# Patient Record
Sex: Female | Born: 1935
Health system: Southern US, Community
[De-identification: ages and names within clinical notes are randomized; demographics above are authoritative.]

## PROBLEM LIST (undated history)

## (undated) DIAGNOSIS — Z7952 Long term (current) use of systemic steroids: Secondary | ICD-10-CM

## (undated) DIAGNOSIS — J45901 Unspecified asthma with (acute) exacerbation: Secondary | ICD-10-CM

## (undated) DIAGNOSIS — E785 Hyperlipidemia, unspecified: Secondary | ICD-10-CM

## (undated) DIAGNOSIS — I10 Essential (primary) hypertension: Secondary | ICD-10-CM

## (undated) DIAGNOSIS — N189 Chronic kidney disease, unspecified: Secondary | ICD-10-CM

## (undated) DIAGNOSIS — T7840XA Allergy, unspecified, initial encounter: Secondary | ICD-10-CM

## (undated) DIAGNOSIS — M1990 Unspecified osteoarthritis, unspecified site: Secondary | ICD-10-CM

## (undated) DIAGNOSIS — I2510 Atherosclerotic heart disease of native coronary artery without angina pectoris: Secondary | ICD-10-CM

## (undated) DIAGNOSIS — D649 Anemia, unspecified: Secondary | ICD-10-CM

## (undated) DIAGNOSIS — J189 Pneumonia, unspecified organism: Secondary | ICD-10-CM

## (undated) DIAGNOSIS — M316 Other giant cell arteritis: Secondary | ICD-10-CM

## (undated) DIAGNOSIS — I209 Angina pectoris, unspecified: Secondary | ICD-10-CM

## (undated) DIAGNOSIS — K5790 Diverticulosis of intestine, part unspecified, without perforation or abscess without bleeding: Secondary | ICD-10-CM

## (undated) DIAGNOSIS — J45909 Unspecified asthma, uncomplicated: Secondary | ICD-10-CM

## (undated) DIAGNOSIS — G629 Polyneuropathy, unspecified: Secondary | ICD-10-CM

## (undated) DIAGNOSIS — J8410 Pulmonary fibrosis, unspecified: Secondary | ICD-10-CM

## (undated) DIAGNOSIS — K449 Diaphragmatic hernia without obstruction or gangrene: Secondary | ICD-10-CM

## (undated) DIAGNOSIS — M810 Age-related osteoporosis without current pathological fracture: Secondary | ICD-10-CM

## (undated) HISTORY — DX: Diaphragmatic hernia without obstruction or gangrene: K44.9

## (undated) HISTORY — DX: Long term (current) use of systemic steroids: Z79.52

## (undated) HISTORY — DX: Pulmonary fibrosis, unspecified: J84.10

## (undated) HISTORY — PX: CATARACT EXTRACTION, BILATERAL: SHX1313

## (undated) HISTORY — DX: Unspecified asthma, uncomplicated: J45.909

## (undated) HISTORY — DX: Diverticulosis of intestine, part unspecified, without perforation or abscess without bleeding: K57.90

## (undated) HISTORY — DX: Angina pectoris, unspecified: I20.9

## (undated) HISTORY — DX: Other giant cell arteritis: M31.6

## (undated) HISTORY — DX: Atherosclerotic heart disease of native coronary artery without angina pectoris: I25.10

## (undated) HISTORY — DX: Unspecified osteoarthritis, unspecified site: M19.90

## (undated) HISTORY — DX: Hyperlipidemia, unspecified: E78.5

## (undated) HISTORY — PX: BLADDER SURGERY: SHX569

## (undated) HISTORY — DX: Anemia, unspecified: D64.9

## (undated) HISTORY — PX: COLON SURGERY: SHX602

## (undated) HISTORY — DX: Polyneuropathy, unspecified: G62.9

---
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
## 1999-03-30 HISTORY — PX: UPPER GI ENDOSCOPY: SHX6162

## 1999-03-30 HISTORY — PX: COLONOSCOPY: SHX174

## 2004-05-25 ENCOUNTER — Inpatient Hospital Stay: Payer: Self-pay | Admitting: Internal Medicine

## 2004-08-08 ENCOUNTER — Ambulatory Visit: Payer: Self-pay | Admitting: Nurse Practitioner

## 2004-11-21 ENCOUNTER — Ambulatory Visit: Payer: Self-pay

## 2005-01-09 ENCOUNTER — Ambulatory Visit: Payer: Self-pay

## 2006-05-23 ENCOUNTER — Ambulatory Visit: Payer: Self-pay | Admitting: Family Medicine

## 2006-08-17 ENCOUNTER — Inpatient Hospital Stay: Payer: Self-pay | Admitting: Internal Medicine

## 2006-09-17 ENCOUNTER — Ambulatory Visit: Payer: Self-pay | Admitting: Specialist

## 2007-07-30 ENCOUNTER — Ambulatory Visit: Payer: Self-pay | Admitting: Family Medicine

## 2008-08-04 ENCOUNTER — Ambulatory Visit: Payer: Self-pay | Admitting: Family Medicine

## 2008-09-19 ENCOUNTER — Inpatient Hospital Stay: Payer: Self-pay | Admitting: Internal Medicine

## 2010-01-14 ENCOUNTER — Emergency Department: Payer: Self-pay | Admitting: Internal Medicine

## 2010-01-21 ENCOUNTER — Emergency Department: Payer: Self-pay | Admitting: Internal Medicine

## 2010-07-19 ENCOUNTER — Encounter: Payer: Self-pay | Admitting: Otolaryngology

## 2010-08-16 ENCOUNTER — Encounter: Payer: Self-pay | Admitting: Otolaryngology

## 2010-09-14 ENCOUNTER — Encounter: Payer: Self-pay | Admitting: Otolaryngology

## 2010-12-06 ENCOUNTER — Observation Stay: Payer: Self-pay | Admitting: Surgery

## 2010-12-14 ENCOUNTER — Ambulatory Visit: Payer: Self-pay | Admitting: Cardiothoracic Surgery

## 2010-12-15 ENCOUNTER — Ambulatory Visit: Payer: Self-pay | Admitting: Cardiothoracic Surgery

## 2011-01-14 ENCOUNTER — Ambulatory Visit: Payer: Self-pay | Admitting: Cardiothoracic Surgery

## 2012-09-20 ENCOUNTER — Emergency Department: Payer: Self-pay | Admitting: Emergency Medicine

## 2012-11-16 ENCOUNTER — Emergency Department: Payer: Self-pay | Admitting: Emergency Medicine

## 2012-11-16 LAB — COMPREHENSIVE METABOLIC PANEL
Albumin: 3.7 g/dL (ref 3.4–5.0)
Alkaline Phosphatase: 75 U/L (ref 50–136)
Anion Gap: 7 (ref 7–16)
BUN: 39 mg/dL — ABNORMAL HIGH (ref 7–18)
Bilirubin,Total: 0.2 mg/dL (ref 0.2–1.0)
Calcium, Total: 9.1 mg/dL (ref 8.5–10.1)
Chloride: 110 mmol/L — ABNORMAL HIGH (ref 98–107)
Co2: 25 mmol/L (ref 21–32)
Creatinine: 1.68 mg/dL — ABNORMAL HIGH (ref 0.60–1.30)
Glucose: 126 mg/dL — ABNORMAL HIGH (ref 65–99)
Osmolality: 294 (ref 275–301)
Potassium: 4.2 mmol/L (ref 3.5–5.1)
SGOT(AST): 29 U/L (ref 15–37)
Sodium: 142 mmol/L (ref 136–145)

## 2012-11-16 LAB — CBC
HCT: 34.6 % — ABNORMAL LOW (ref 40.0–52.0)
MCH: 27.1 pg (ref 26.0–34.0)
MCHC: 32.9 g/dL (ref 32.0–36.0)
RBC: 4.2 10*6/uL — ABNORMAL LOW (ref 4.40–5.90)
RDW: 14.8 % — ABNORMAL HIGH (ref 11.5–14.5)
WBC: 5.4 10*3/uL (ref 3.8–10.6)

## 2013-10-14 ENCOUNTER — Emergency Department: Payer: Self-pay | Admitting: Emergency Medicine

## 2013-10-14 DIAGNOSIS — M316 Other giant cell arteritis: Secondary | ICD-10-CM

## 2013-10-14 HISTORY — DX: Other giant cell arteritis: M31.6

## 2013-10-14 LAB — CBC WITH DIFFERENTIAL/PLATELET
Basophil #: 0.1 10*3/uL (ref 0.0–0.1)
Basophil %: 1 %
Eosinophil #: 0.2 10*3/uL (ref 0.0–0.7)
Eosinophil %: 4.6 %
HCT: 34.9 % — ABNORMAL LOW (ref 40.0–52.0)
HGB: 11.1 g/dL — ABNORMAL LOW (ref 12.0–16.0)
Lymphocyte #: 2 10*3/uL (ref 1.0–3.6)
Lymphocyte %: 38.4 %
MCH: 26.6 pg (ref 26.0–34.0)
MCHC: 31.7 g/dL — ABNORMAL LOW (ref 32.0–36.0)
MCV: 84 fL (ref 80–100)
Monocyte #: 0.5 10*3/uL (ref 0.2–1.0)
Monocyte %: 8.8 %
NEUTROS ABS: 2.5 10*3/uL (ref 1.4–6.5)
NEUTROS PCT: 47.2 %
Platelet: 158 10*3/uL (ref 150–440)
RBC: 4.16 10*6/uL — ABNORMAL LOW (ref 4.40–5.90)
RDW: 15 % — ABNORMAL HIGH (ref 11.5–14.5)
WBC: 5.2 10*3/uL (ref 3.8–10.6)

## 2013-10-14 LAB — COMPREHENSIVE METABOLIC PANEL
ALBUMIN: 3.6 g/dL (ref 3.4–5.0)
ALT: 15 U/L (ref 12–78)
AST: 25 U/L (ref 15–37)
Alkaline Phosphatase: 88 U/L
Anion Gap: 5 — ABNORMAL LOW (ref 7–16)
BUN: 31 mg/dL — AB (ref 7–18)
Bilirubin,Total: 0.2 mg/dL (ref 0.2–1.0)
Calcium, Total: 9.1 mg/dL (ref 8.5–10.1)
Chloride: 104 mmol/L (ref 98–107)
Co2: 29 mmol/L (ref 21–32)
Creatinine: 1.6 mg/dL — ABNORMAL HIGH (ref 0.60–1.30)
EGFR (African American): 30 — ABNORMAL LOW
EGFR (Non-African Amer.): 26 — ABNORMAL LOW
Glucose: 85 mg/dL (ref 65–99)
OSMOLALITY: 281 (ref 275–301)
POTASSIUM: 4.3 mmol/L (ref 3.5–5.1)
SODIUM: 138 mmol/L (ref 136–145)
Total Protein: 8.7 g/dL — ABNORMAL HIGH (ref 6.4–8.2)

## 2013-10-14 LAB — TSH: THYROID STIMULATING HORM: 1.01 u[IU]/mL

## 2013-10-14 LAB — PRO B NATRIURETIC PEPTIDE: B-Type Natriuretic Peptide: 1195 pg/mL — ABNORMAL HIGH (ref 0–125)

## 2013-10-14 LAB — SEDIMENTATION RATE: ERYTHROCYTE SED RATE: 57 mm/h — AB (ref 0–20)

## 2013-10-14 LAB — TROPONIN I: Troponin-I: <0.02 ng/mL

## 2013-11-28 ENCOUNTER — Emergency Department: Payer: Self-pay | Admitting: Emergency Medicine

## 2013-11-28 LAB — CBC
HCT: 39 % — ABNORMAL LOW (ref 40.0–52.0)
HGB: 12.5 g/dL (ref 12.0–16.0)
MCH: 27.1 pg (ref 26.0–34.0)
MCHC: 31.9 g/dL — ABNORMAL LOW (ref 32.0–36.0)
MCV: 85 fL (ref 80–100)
PLATELETS: 99 10*3/uL — AB (ref 150–440)
RBC: 4.6 10*6/uL (ref 4.40–5.90)
RDW: 15.4 % — AB (ref 11.5–14.5)
WBC: 7.5 10*3/uL (ref 3.8–10.6)

## 2013-11-28 LAB — COMPREHENSIVE METABOLIC PANEL
ALBUMIN: 2.6 g/dL — AB (ref 3.4–5.0)
ALK PHOS: 68 U/L
ANION GAP: 6 — AB (ref 7–16)
BUN: 34 mg/dL — ABNORMAL HIGH (ref 7–18)
Bilirubin,Total: 0.3 mg/dL (ref 0.2–1.0)
CALCIUM: 8.3 mg/dL — AB (ref 8.5–10.1)
CHLORIDE: 103 mmol/L (ref 98–107)
CO2: 28 mmol/L (ref 21–32)
Creatinine: 1.47 mg/dL — ABNORMAL HIGH (ref 0.60–1.30)
EGFR (African American): 33 — ABNORMAL LOW
GFR CALC NON AF AMER: 28 — AB
GLUCOSE: 87 mg/dL (ref 65–99)
Osmolality: 281 (ref 275–301)
POTASSIUM: 4.8 mmol/L (ref 3.5–5.1)
SGOT(AST): 41 U/L — ABNORMAL HIGH (ref 15–37)
SGPT (ALT): 34 U/L (ref 12–78)
Sodium: 137 mmol/L (ref 136–145)
TOTAL PROTEIN: 6.7 g/dL (ref 6.4–8.2)

## 2013-11-28 LAB — URINALYSIS, COMPLETE
BACTERIA: NONE SEEN
BLOOD: NEGATIVE
Bilirubin,UR: NEGATIVE
GLUCOSE, UR: NEGATIVE mg/dL (ref 0–75)
Ketone: NEGATIVE
LEUKOCYTE ESTERASE: NEGATIVE
NITRITE: NEGATIVE
PH: 6 (ref 4.5–8.0)
Protein: NEGATIVE
Specific Gravity: 1.004 (ref 1.003–1.030)
Squamous Epithelial: <1
WBC UR: <1 /HPF (ref 0–5)

## 2013-11-28 LAB — ETHANOL

## 2013-11-28 LAB — DRUG SCREEN, URINE

## 2013-11-28 LAB — SALICYLATE LEVEL

## 2013-11-28 LAB — ACETAMINOPHEN LEVEL

## 2013-11-28 LAB — TSH: Thyroid Stimulating Horm: 0.64 u[IU]/mL

## 2014-01-09 ENCOUNTER — Inpatient Hospital Stay: Payer: Self-pay | Admitting: Internal Medicine

## 2014-01-09 LAB — COMPREHENSIVE METABOLIC PANEL
ALT: 21 U/L (ref 12–78)
ANION GAP: 7 (ref 7–16)
Albumin: 2.9 g/dL — ABNORMAL LOW (ref 3.4–5.0)
Alkaline Phosphatase: 64 U/L
BUN: 20 mg/dL — ABNORMAL HIGH (ref 7–18)
Bilirubin,Total: 0.3 mg/dL (ref 0.2–1.0)
CHLORIDE: 100 mmol/L (ref 98–107)
Calcium, Total: 8.4 mg/dL — ABNORMAL LOW (ref 8.5–10.1)
Co2: 29 mmol/L (ref 21–32)
Creatinine: 1.77 mg/dL — ABNORMAL HIGH (ref 0.60–1.30)
GFR CALC AF AMER: 26 — AB
GFR CALC NON AF AMER: 23 — AB
GLUCOSE: 121 mg/dL — AB (ref 65–99)
Osmolality: 276 (ref 275–301)
POTASSIUM: 4.6 mmol/L (ref 3.5–5.1)
SGOT(AST): 37 U/L (ref 15–37)
SODIUM: 136 mmol/L (ref 136–145)
Total Protein: 7.6 g/dL (ref 6.4–8.2)

## 2014-01-09 LAB — CBC
HCT: 35.8 % — AB (ref 40.0–52.0)
HGB: 11.2 g/dL — ABNORMAL LOW (ref 12.0–16.0)
MCH: 26.3 pg (ref 26.0–34.0)
MCHC: 31.2 g/dL — AB (ref 32.0–36.0)
MCV: 84 fL (ref 80–100)
Platelet: 158 10*3/uL (ref 150–440)
RBC: 4.25 10*6/uL — ABNORMAL LOW (ref 4.40–5.90)
RDW: 15.8 % — ABNORMAL HIGH (ref 11.5–14.5)
WBC: 5.4 10*3/uL (ref 3.8–10.6)

## 2014-01-09 LAB — TROPONIN I: Troponin-I: 0.09 ng/mL — ABNORMAL HIGH

## 2014-01-09 LAB — PRO B NATRIURETIC PEPTIDE: B-Type Natriuretic Peptide: 5732 pg/mL — ABNORMAL HIGH (ref 0–125)

## 2014-01-10 LAB — BASIC METABOLIC PANEL
ANION GAP: 6 — AB (ref 7–16)
BUN: 21 mg/dL — ABNORMAL HIGH (ref 7–18)
CALCIUM: 8.2 mg/dL — AB (ref 8.5–10.1)
Chloride: 100 mmol/L (ref 98–107)
Co2: 28 mmol/L (ref 21–32)
Creatinine: 1.76 mg/dL — ABNORMAL HIGH (ref 0.60–1.30)
EGFR (African American): 26 — ABNORMAL LOW
GFR CALC NON AF AMER: 23 — AB
GLUCOSE: 212 mg/dL — AB (ref 65–99)
OSMOLALITY: 278 (ref 275–301)
Potassium: 4.3 mmol/L (ref 3.5–5.1)
Sodium: 134 mmol/L — ABNORMAL LOW (ref 136–145)

## 2014-01-10 LAB — TROPONIN I
TROPONIN-I: 0.06 ng/mL — AB
Troponin-I: 0.06 ng/mL — ABNORMAL HIGH

## 2014-01-10 LAB — CBC WITH DIFFERENTIAL/PLATELET
Basophil #: 0 10*3/uL (ref 0.0–0.1)
Basophil %: 0 %
EOS PCT: 0 %
Eosinophil #: 0 10*3/uL (ref 0.0–0.7)
HCT: 32.7 % — ABNORMAL LOW (ref 40.0–52.0)
HGB: 10.3 g/dL — AB (ref 12.0–16.0)
Lymphocyte #: 0.3 10*3/uL — ABNORMAL LOW (ref 1.0–3.6)
Lymphocyte %: 6.7 %
MCH: 26.6 pg (ref 26.0–34.0)
MCHC: 31.4 g/dL — AB (ref 32.0–36.0)
MCV: 84 fL (ref 80–100)
Monocyte #: 0.1 10*3/uL — ABNORMAL LOW (ref 0.2–1.0)
Monocyte %: 3.3 %
NEUTROS PCT: 90 %
Neutrophil #: 3.7 10*3/uL (ref 1.4–6.5)
Platelet: 142 10*3/uL — ABNORMAL LOW (ref 150–440)
RBC: 3.87 10*6/uL — ABNORMAL LOW (ref 4.40–5.90)
RDW: 15.6 % — ABNORMAL HIGH (ref 11.5–14.5)
WBC: 4.1 10*3/uL (ref 3.8–10.6)

## 2014-01-11 LAB — BASIC METABOLIC PANEL
Anion Gap: 4 — ABNORMAL LOW (ref 7–16)
BUN: 21 mg/dL — AB (ref 7–18)
CREATININE: 1.62 mg/dL — AB (ref 0.60–1.30)
Calcium, Total: 8.5 mg/dL (ref 8.5–10.1)
Chloride: 105 mmol/L (ref 98–107)
Co2: 31 mmol/L (ref 21–32)
EGFR (African American): 29 — ABNORMAL LOW
EGFR (Non-African Amer.): 25 — ABNORMAL LOW
GLUCOSE: 107 mg/dL — AB (ref 65–99)
Osmolality: 283 (ref 275–301)
Potassium: 4.3 mmol/L (ref 3.5–5.1)
Sodium: 140 mmol/L (ref 136–145)

## 2014-01-14 LAB — CULTURE, BLOOD (SINGLE)

## 2014-02-07 ENCOUNTER — Observation Stay: Payer: Self-pay | Admitting: Family Medicine

## 2014-02-07 LAB — CBC
HCT: 30.3 % — AB (ref 40.0–52.0)
HGB: 9.5 g/dL — ABNORMAL LOW (ref 12.0–16.0)
MCH: 26.7 pg (ref 26.0–34.0)
MCHC: 31.4 g/dL — ABNORMAL LOW (ref 32.0–36.0)
MCV: 85 fL (ref 80–100)
Platelet: 153 10*3/uL (ref 150–440)
RBC: 3.56 10*6/uL — AB (ref 4.40–5.90)
RDW: 16.3 % — ABNORMAL HIGH (ref 11.5–14.5)
WBC: 8.4 10*3/uL (ref 3.8–10.6)

## 2014-02-07 LAB — BASIC METABOLIC PANEL
Anion Gap: 4 — ABNORMAL LOW (ref 7–16)
BUN: 23 mg/dL — AB (ref 7–18)
CHLORIDE: 105 mmol/L (ref 98–107)
Calcium, Total: 8.7 mg/dL (ref 8.5–10.1)
Co2: 33 mmol/L — ABNORMAL HIGH (ref 21–32)
Creatinine: 1.41 mg/dL — ABNORMAL HIGH (ref 0.60–1.30)
EGFR (Non-African Amer.): 30 — ABNORMAL LOW
GFR CALC AF AMER: 34 — AB
Glucose: 88 mg/dL (ref 65–99)
Osmolality: 286 (ref 275–301)
POTASSIUM: 4.4 mmol/L (ref 3.5–5.1)
SODIUM: 142 mmol/L (ref 136–145)

## 2014-02-07 LAB — URINALYSIS, COMPLETE
BILIRUBIN, UR: NEGATIVE
Bacteria: NONE SEEN
Blood: NEGATIVE
Glucose,UR: NEGATIVE mg/dL (ref 0–75)
Ketone: NEGATIVE
Nitrite: NEGATIVE
PROTEIN: NEGATIVE
Ph: 8 (ref 4.5–8.0)
RBC,UR: 2 /HPF (ref 0–5)
SPECIFIC GRAVITY: 1.01 (ref 1.003–1.030)
WBC UR: 46 /HPF (ref 0–5)

## 2014-02-08 LAB — CBC WITH DIFFERENTIAL/PLATELET
Basophil #: 0 10*3/uL (ref 0.0–0.1)
Basophil %: 0.6 %
Eosinophil #: 0.1 10*3/uL (ref 0.0–0.7)
Eosinophil %: 1.2 %
HCT: 25.8 % — ABNORMAL LOW (ref 40.0–52.0)
HGB: 8.1 g/dL — ABNORMAL LOW (ref 12.0–16.0)
LYMPHS ABS: 1 10*3/uL (ref 1.0–3.6)
LYMPHS PCT: 16.4 %
MCH: 26.5 pg (ref 26.0–34.0)
MCHC: 31.3 g/dL — ABNORMAL LOW (ref 32.0–36.0)
MCV: 85 fL (ref 80–100)
MONO ABS: 0.8 10*3/uL (ref 0.2–1.0)
Monocyte %: 14.1 %
Neutrophil #: 4 10*3/uL (ref 1.4–6.5)
Neutrophil %: 67.7 %
PLATELETS: 137 10*3/uL — AB (ref 150–440)
RBC: 3.04 10*6/uL — AB (ref 4.40–5.90)
RDW: 16 % — ABNORMAL HIGH (ref 11.5–14.5)
WBC: 5.9 10*3/uL (ref 3.8–10.6)

## 2014-02-08 LAB — BASIC METABOLIC PANEL
Anion Gap: 6 — ABNORMAL LOW (ref 7–16)
BUN: 17 mg/dL (ref 7–18)
CREATININE: 1.38 mg/dL — AB (ref 0.60–1.30)
Calcium, Total: 8.4 mg/dL — ABNORMAL LOW (ref 8.5–10.1)
Chloride: 108 mmol/L — ABNORMAL HIGH (ref 98–107)
Co2: 29 mmol/L (ref 21–32)
EGFR (African American): 35 — ABNORMAL LOW
EGFR (Non-African Amer.): 30 — ABNORMAL LOW
Glucose: 88 mg/dL (ref 65–99)
OSMOLALITY: 286 (ref 275–301)
Potassium: 4.1 mmol/L (ref 3.5–5.1)
SODIUM: 143 mmol/L (ref 136–145)

## 2014-02-10 ENCOUNTER — Encounter: Payer: Self-pay | Admitting: Internal Medicine

## 2014-02-12 LAB — SEDIMENTATION RATE: Erythrocyte Sed Rate: 42 mm/hr — ABNORMAL HIGH (ref 0–20)

## 2014-02-13 ENCOUNTER — Encounter: Payer: Self-pay | Admitting: Internal Medicine

## 2014-02-16 LAB — CBC WITH DIFFERENTIAL/PLATELET
Basophil #: 0 10*3/uL (ref 0.0–0.1)
Basophil %: 0.4 %
EOS PCT: 2.2 %
Eosinophil #: 0.1 10*3/uL (ref 0.0–0.7)
HCT: 27.2 % — AB (ref 40.0–52.0)
HGB: 8.4 g/dL — AB (ref 12.0–16.0)
LYMPHS ABS: 1.2 10*3/uL (ref 1.0–3.6)
Lymphocyte %: 20 %
MCH: 26.7 pg (ref 26.0–34.0)
MCHC: 30.9 g/dL — AB (ref 32.0–36.0)
MCV: 86 fL (ref 80–100)
MONO ABS: 0.6 10*3/uL (ref 0.2–1.0)
Monocyte %: 10.1 %
NEUTROS ABS: 4.1 10*3/uL (ref 1.4–6.5)
Neutrophil %: 67.3 %
Platelet: 217 10*3/uL (ref 150–440)
RBC: 3.15 10*6/uL — AB (ref 4.40–5.90)
RDW: 16.5 % — AB (ref 11.5–14.5)
WBC: 6.1 10*3/uL (ref 3.8–10.6)

## 2014-07-24 DIAGNOSIS — J189 Pneumonia, unspecified organism: Secondary | ICD-10-CM | POA: Diagnosis not present

## 2014-07-24 DIAGNOSIS — J449 Chronic obstructive pulmonary disease, unspecified: Secondary | ICD-10-CM | POA: Diagnosis not present

## 2014-07-24 DIAGNOSIS — M159 Polyosteoarthritis, unspecified: Secondary | ICD-10-CM | POA: Diagnosis not present

## 2014-07-24 DIAGNOSIS — E785 Hyperlipidemia, unspecified: Secondary | ICD-10-CM | POA: Diagnosis not present

## 2014-07-26 DIAGNOSIS — H1131 Conjunctival hemorrhage, right eye: Secondary | ICD-10-CM | POA: Diagnosis not present

## 2014-07-29 DIAGNOSIS — J4520 Mild intermittent asthma, uncomplicated: Secondary | ICD-10-CM | POA: Diagnosis not present

## 2014-07-29 DIAGNOSIS — R590 Localized enlarged lymph nodes: Secondary | ICD-10-CM | POA: Diagnosis not present

## 2014-08-01 DIAGNOSIS — J449 Chronic obstructive pulmonary disease, unspecified: Secondary | ICD-10-CM | POA: Diagnosis not present

## 2014-08-01 DIAGNOSIS — J189 Pneumonia, unspecified organism: Secondary | ICD-10-CM | POA: Diagnosis not present

## 2014-08-05 DIAGNOSIS — N183 Chronic kidney disease, stage 3 (moderate): Secondary | ICD-10-CM | POA: Diagnosis not present

## 2014-08-05 DIAGNOSIS — Z7952 Long term (current) use of systemic steroids: Secondary | ICD-10-CM | POA: Diagnosis not present

## 2014-08-05 DIAGNOSIS — I10 Essential (primary) hypertension: Secondary | ICD-10-CM | POA: Diagnosis not present

## 2014-08-05 DIAGNOSIS — M353 Polymyalgia rheumatica: Secondary | ICD-10-CM | POA: Diagnosis not present

## 2014-08-05 DIAGNOSIS — I5032 Chronic diastolic (congestive) heart failure: Secondary | ICD-10-CM | POA: Diagnosis not present

## 2014-08-05 DIAGNOSIS — M150 Primary generalized (osteo)arthritis: Secondary | ICD-10-CM | POA: Diagnosis not present

## 2014-08-24 DIAGNOSIS — J189 Pneumonia, unspecified organism: Secondary | ICD-10-CM | POA: Diagnosis not present

## 2014-08-24 DIAGNOSIS — M159 Polyosteoarthritis, unspecified: Secondary | ICD-10-CM | POA: Diagnosis not present

## 2014-08-24 DIAGNOSIS — J449 Chronic obstructive pulmonary disease, unspecified: Secondary | ICD-10-CM | POA: Diagnosis not present

## 2014-08-24 DIAGNOSIS — E785 Hyperlipidemia, unspecified: Secondary | ICD-10-CM | POA: Diagnosis not present

## 2014-09-01 DIAGNOSIS — J449 Chronic obstructive pulmonary disease, unspecified: Secondary | ICD-10-CM | POA: Diagnosis not present

## 2014-09-01 DIAGNOSIS — J189 Pneumonia, unspecified organism: Secondary | ICD-10-CM | POA: Diagnosis not present

## 2014-09-08 DIAGNOSIS — M353 Polymyalgia rheumatica: Secondary | ICD-10-CM | POA: Diagnosis not present

## 2014-09-22 DIAGNOSIS — J449 Chronic obstructive pulmonary disease, unspecified: Secondary | ICD-10-CM | POA: Diagnosis not present

## 2014-09-30 DIAGNOSIS — J449 Chronic obstructive pulmonary disease, unspecified: Secondary | ICD-10-CM | POA: Diagnosis not present

## 2014-09-30 DIAGNOSIS — J189 Pneumonia, unspecified organism: Secondary | ICD-10-CM | POA: Diagnosis not present

## 2014-10-06 DIAGNOSIS — J449 Chronic obstructive pulmonary disease, unspecified: Secondary | ICD-10-CM | POA: Diagnosis not present

## 2014-10-12 DIAGNOSIS — M353 Polymyalgia rheumatica: Secondary | ICD-10-CM | POA: Diagnosis not present

## 2014-10-20 DIAGNOSIS — Z933 Colostomy status: Secondary | ICD-10-CM | POA: Diagnosis not present

## 2014-10-23 DIAGNOSIS — J449 Chronic obstructive pulmonary disease, unspecified: Secondary | ICD-10-CM | POA: Diagnosis not present

## 2014-10-31 DIAGNOSIS — J449 Chronic obstructive pulmonary disease, unspecified: Secondary | ICD-10-CM | POA: Diagnosis not present

## 2014-10-31 DIAGNOSIS — J189 Pneumonia, unspecified organism: Secondary | ICD-10-CM | POA: Diagnosis not present

## 2014-11-02 DIAGNOSIS — M353 Polymyalgia rheumatica: Secondary | ICD-10-CM | POA: Diagnosis not present

## 2014-11-06 DIAGNOSIS — J449 Chronic obstructive pulmonary disease, unspecified: Secondary | ICD-10-CM | POA: Diagnosis not present

## 2014-11-06 NOTE — Discharge Summary (Signed)
PATIENT NAME:  Briana [name], Skarleth MR#:  [id number] DATE OF BIRTH:  1907/11/19  DATE OF ADMISSION:  01/09/2014.  DATE OF DISCHARGE:  01/11/2014.  ADMISSION DIAGNOSES:  Acute respiratory failure.  1.  Pneumonia.  2.  History of temporal arteritis.  3.  Acute kidney injury.  4.  Debility.  5.  Elevated troponin.  CONSULTATIONS: Physical therapy.  LABORATORIES AT DISCHARGE: Sodium 140, potassium 4.3, chloride 105, bicarbonate 31, BUN 21, creatinine 1.62, glucose 107, troponin 0.06.   HOSPITAL COURSE: This is 81 year old female who presented with shortness of breath, was found to have pneumonia.  For further details see medical records.  1.  Pneumonia, community-acquired. However, the patient has been on steroids for her temporal arteritis. She was placed on cefepime as well as vancomycin. She is doing better. She uses chronic oxygen at home with 3 liters at night. She will need this during the day, she was using oxygen during the day as well, until she can get over this pneumonia and bronchitis.  2.  Temporal arteritis. The patient was on stress-dose steroids. She will go back to her normal dose of steroids.  3.  Acute kidney injury on chronic kidney disease, stage IV. The patient will need outpatient followup.  4.  Debility with weakness due to her pneumonia. The patient will need home health.   DISCHARGE MEDICATIONS:  1.  Aspirin 81 mg daily.  2.  Fluticasone 2 sprays daily.  3.  Vitamin D2, take 50,000 units on Thursdays.    4.  Nexium 40 mg daily.  5.  Vitamin D3, one tablet daily.  6.  HCTZ 12.5 daily.  7.  Prednisone 10 mg daily.  8.  Risperdal 0.5 mg 0.5 tablet b.i.d.  9.  Levaquin 750 mg daily for 6 days.  10. Discharge oxygen 3 liters nasal cannula.   Discharge with home health with physical therapy, nurse and nurse aide.   DISCHARGE DIET: A low sodium.   DISCHARGE OXYGEN: Portable tank, at 3 liters continuous.   DISCHARGE REFERRAL: Home health.   The patient is stable for  discharge. She will follow up with her M.D. in 1 week.    TIME SPENT:  35 minutes.   ____________________________ Janyth ContesSital P. Juliene PinaMody, MD spm:lt D: 01/11/2014 12:30:52 ET T: 01/11/2014 12:44:04 ET JOB#: [id number]  cc: Sital P. Juliene PinaMody, MD, <Dictator> Janyth ContesSITAL P MODY MD ELECTRONICALLY SIGNED 01/12/2014 12:23

## 2014-11-06 NOTE — Discharge Summary (Signed)
PATIENT NAME:  Briana [name], Briana [name] MR#:  [id number] DATE OF BIRTH:  19-Nov-1907  DATE OF ADMISSION:  02/07/2014 DATE OF DISCHARGE:  02/08/2014  DISCHARGE DIAGNOSIS: Acute right 8th, 9th, and 10th rib fractures, status post fall.   DISCHARGE MEDICATIONS:  1.  Aspirin 81 mg p.o. daily.  2.  Vitamin D2, 50,000 international units once weekly.  3.  Nexium 40 mg p.o. daily.  4.  Prednisone 10 mg p.o. daily.  5.  Risperdal 0.25 mg 0.5 tablet p.o. b.i.d.  6.  Tramadol 50 mg p.o. q.6 hours as needed for pain.   The patient will need incentive spirometry as well.   PERTINENT LABORATORY AND STUDIES: The patient had chest x-ray, dedicate rib films did confirm fractures of the 8th, 9th and 10th ribs on the right.   CT of the neck was negative.   CT of the head was negative.   DISPOSITION: The patient is in stable condition to be discharged to home with incentive spirometry and pain control.   BRIEF HOSPITAL COURSE: This is a 81 year old female who was admitted with acute rib fractures status post fall who had a full workup that was negative. She feels stable and is breathing fine and wants to go back home where her son is her caretaker. I think this is appropriate. We will control her pain with Ultram. She will follow up with her PCP and continue incentive spirometry for good ventilation.    ____________________________ Marisue IvanKanhka Deiona Hooper, MD kl:lt D: 02/08/2014 08:20:59 ET T: 02/08/2014 08:28:33 ET JOB#: [id number]  cc: Marisue IvanKanhka Alizeh Madril, MD, <Dictator> Marisue IvanKANHKA Tavone Caesar MD ELECTRONICALLY SIGNED 03/15/2014 8:23

## 2014-11-06 NOTE — H&P (Signed)
PATIENT NAME:  Briana [name], [name] MR#:  [id number] DATE OF BIRTH:  07-Jun-1908  DATE OF ADMISSION:  02/07/2014  REFERRING PHYSICIAN: Governor Rooksebecca Lord, M.D.   FAMILY PROVIDER: Maurine MinisterMiriam McLaughlin, PA-C.   REASON FOR ADMISSION: Traumatic rib fractures.   HISTORY OF PRESENT ILLNESS: The patient is a [age] year old female who lives at home with a history of COPD, recent pneumonia, and temporal arteritis. She presents to the Emergency Room with rib pain after falling out of bed. In the Emergency Room, the patient was found to have rib fractures. She is unable to manage at home due to the severe pain and is now admitted for further evaluation.   PAST MEDICAL HISTORY:  1.  Temporal arteritis.  2.  COPD.  3.  Recent pneumonia.  4.  Benign hypertension.  5.  Chronic anemia.  6.  Stage III chronic kidney disease.  7.  History of cardiac murmur. 8.  Previous colostomy.   MEDICATIONS:  1.  Aspirin 81 mg p.o. daily.  2.  Nexium 40 mg p.o. daily.  3.  Prednisone 10 mg p.o. daily.  4.  Risperdal 0.125 mg p.o. b.i.d.  5.  Vitamin D2 50,000 units p.o. every week.   ALLERGIES: AMOXICILLIN.   SOCIAL HISTORY: Negative for alcohol or tobacco abuse.   FAMILY HISTORY: Negative for coronary artery disease. Essentially unremarkable.   REVIEW OF SYSTEMS: CONSTITUTIONAL: No fever or change in weight.  EYES: No blurred or double vision. No glaucoma.  ENT: No tinnitus or hearing loss. No nasal discharge or bleeding. No difficulty swallowing.  RESPIRATORY: No cough or wheezing. Denies hemoptysis.  CARDIOVASCULAR: No orthopnea or palpitations. No syncope.  GASTROINTESTINAL: No nausea, vomiting, or diarrhea. No abdominal pain.  GENITOURINARY: No dysuria or hematuria. No incontinence.  ENDOCRINE: No polyuria or polydipsia. No heat or cold intolerance.  HEMATOLOGIC: No bruising or bleeding.  LYMPHATIC: No swollen glands.  MUSCULOSKELETAL: The patient denies pain in her neck, shoulders, knees, or hips. No gout.   NEUROLOGIC: No migraines or stroke. Denies seizures.  PSYCHIATRIC: The patient denies anxiety, insomnia or depression.   PHYSICAL EXAMINATION:  GENERAL: The patient is chronically ill-appearing, but in no acute distress.  VITAL SIGNS: Currently remarkable for a blood pressure of 136/57 with a heart rate of 52, respiratory rate of 18, temperature 97.7, oxygen saturation 98% on room air.  HEENT: Normocephalic, atraumatic. Pupils equally round and reactive to light and accommodation. Extraocular movements are intact. Sclerae are anicteric. Conjunctivae are clear.  OROPHARYNX: Clear.  NECK: Supple without JVD. No adenopathy or thyromegaly is noted.  LUNGS: Scattered rhonchi, without wheezes or rales. No dullness. Respiratory effort is normal.  CARDIAC: Regular rate and rhythm with normal S1, S2. No significant rubs, murmurs or gallops. PMI is nondisplaced. Chest wall is tender to palpation along the right posterior rib cage.  ABDOMEN: Soft, nontender, with normoactive bowel sounds. No organomegaly or masses were appreciated. No hernias or bruits were noted.  EXTREMITIES: Without clubbing, cyanosis or edema. Pulses were 1+ bilaterally.  SKIN: Warm and dry without rash or lesions.  NEUROLOGIC: Cranial nerves II-XII grossly intact. Deep tendon reflexes were symmetric. Motor and sensory exam is nonfocal.  PSYCHIATRIC: Revealed a patient who was alert and oriented to person, place, and time. She was cooperative and used good judgment.   LABORATORY DATA:  Glucose 88, BUN 23, creatinine 1.41, sodium 142, potassium 4.4, GFR 34. White count 8.4, hemoglobin 9.5.  EKG revealed sinus rhythm with nonspecific T-wave changes.   CT of the head was unremarkable.  CT of the cervical spine revealed spondylitic changes with no acute fracture.   Rib films revealed acute fractures at the 8th, 9th, and 10th ribs on the right. No pneumothorax is present.   ASSESSMENT:  1.  Intractable pain.  2.  Acute rib fractures  due to trauma.  3.  Cardiac murmur of unclear significance.  4.  Recent pneumonia.  5.  Chronic obstructive pulmonary disease.  6.  Temporal arteritis.  7.  Stage III chronic kidney disease.  8.  Anemia of chronic disease.   PLAN: The patient will be observed on the floor with a heating pad and p.r.n. pain medications. Will use DuoNeb, small volume inhalers, and incentive spirometry to prevent pneumonia. We will begin IV antibiotics empirically now. Urinalysis is pending. We will consult pulmonary as well as physical therapy and care management. May require temporary placement. We will continue her outpatient medications for now. Followup routine laboratory studies in the morning. Further treatment and evaluation will depend upon the patient's progress.   Total time spent on this patient: 50 minutes.    ____________________________ [name] Judithann Sheen, MD jds:lt D: 02/07/2014 11:21:28 ET T: 02/07/2014 11:43:52 ET JOB#: [id number]  cc: [name]. Judithann Sheen, MD, <Dictator> Marisue Ivan, MD Trevionne Advani Rodena Medin MD ELECTRONICALLY SIGNED 02/07/2014 18:02

## 2014-11-06 NOTE — Discharge Summary (Signed)
PATIENT NAME:  Briana [name], [name] MR#:  [id number] DATE OF BIRTH:  April 28, 1908  DATE OF ADMISSION:  02/07/2014 DATE OF DISCHARGE:  02/09/2014  ADDENDUM  The patient was initially going to be discharged to home, but after discussing her case with her family members they noted that she was unsafe to return home. She was evaluated by physical therapy and case management and suited for SNF placement. [facility] and [facility], so please update discharge summary.   ____________________________ Marisue IvanKanhka Naphtali Riede, MD kl:sb D: 02/09/2014 10:04:55 ET T: 02/09/2014 10:34:53 ET JOB#: [id number]  cc: Marisue IvanKanhka Shellsea Borunda, MD, <Dictator> Marisue IvanKANHKA Dolphus Linch MD ELECTRONICALLY SIGNED 03/15/2014 8:23

## 2014-11-06 NOTE — H&P (Signed)
PATIENT NAME:  Briana [name], Briana [name] MR#:  [id number] DATE OF BIRTH:  [birth year]  DATE OF ADMISSION:  01/09/2014  PRIMARY CARE PHYSICIAN: Nonlocal.   REFERRING PHYSICIAN: John H. Margarita Grizzle, MD   CHIEF COMPLAINT: Cough, shortness of breath.   HISTORY OF PRESENT ILLNESS: Briana [name] is a 81 year old female with history of hypertension, temporal arteritis on prednisone, colostomy for the last 50 years, who comes to the Emergency Department with complaints of cough and shortness of breath for the last 3 to 4 days. The patient was noted to have subjective fevers. Has decreased p.o. intake, has been sleeping most of the day. At baseline, the patient walks with the help of a walker. For the last 3 days has been experiencing shortness of breath and generalized weakness with any exertion. Concerning this, the patient is brought to the Emergency Department. On workup in the Emergency Department, the patient is found to have probable bi-basilar scarring or atelectasis, most likely pneumonia. The patient does not have any elevated white blood cell count, having cough with productive sputum. The patient received levofloxacin in the Emergency Department as well as Decadron.   PAST MEDICAL HISTORY: 1. Hypertension.  2. Temporal arteritis.  3. Previous history of pneumonia.  4. Colostomy.   ALLERGIES: AMOXICILLIN.   HOME MEDICATIONS: 1. Vitamin D3 at 1 capsule once a day.  2. Vitamin D2 at 50,000 once a day.  3. Vitamin E 1 capsule once a day.  4. Prednisone 40 mg once a day.  5. Nexium 40 mg once a day.  6. Fluticasone to both nostrils.  7. Calcium carbonate once a day.  8. Aspirin 81 mg daily.   SOCIAL HISTORY: No history of smoking, drinking alcohol or using illicit drugs. Lives with her daughter, independent of ADLs.   FAMILY HISTORY: Osteoarthritis.   REVIEW OF SYSTEMS: The patient is quite somnolent and very hard of hearing, unable to obtain the review of systems. Reviewed with the patient's granddaughters  who are at the bedside and found to be negative except as mentioned in history of present illness; however, unable to obtain from the patient.   PHYSICAL EXAMINATION: GENERAL: This is a well-built, well-nourished, age-appropriate female lying down in the bed, not in distress.  VITAL SIGNS: Temperature 99.3, pulse 98, blood pressure 134/63, respiratory rate of 22, oxygen saturation is 95% on 2 liters of oxygen.  HEENT: Head normocephalic, atraumatic. There is no scleral icterus, conjunctivae normal. Pupils equal and react to light. I could not examine the extraocular movements. Mucous membranes: Mild dryness. I could not examine the oropharynx.  NECK: Supple. No lymphadenopathy. No JVD. No carotid bruit. No thyromegaly.  CHEST: No focal tenderness, bilateral wheezing is heard.  HEART: S1 and S2 regular. Diastolic murmur heard in the mitral area. No pedal edema.  ABDOMEN: Has a colostomy bag with a small hard stool in the colostomy bag. Nontender, nondistended. Could not appreciate any hepatosplenomegaly.  SKIN: No rash or lesions.  MUSCULOSKELETAL: Could not examine as the patient is somnolent and is not responsive.  NEUROLOGIC: The patient is somnolent, arousable, but quickly falls asleep. No apparent cranial nerve abnormalities. Could not examine the motor and sensory.   LABORATORY DATA: CBC: WBC of 5.4, hemoglobin 11.2, platelet count of 158,000. CMP:  BUN 20, creatinine of 1.77. The rest of all the values are within normal limits. Troponin 0.09. ABG: pH of 7.42, pCO2 of 47, paO2 of 130 on oxygen.   Chest x-ray as mentioned above shows bibasilar atelectasis/pneumonia.   ASSESSMENT AND  PLAN: Briana [name] is a 81 year old female who comes to the Emergency Department with complaints of cough, shortness of breath.  1. Pneumonia. The patient has been on steroids for the last 3 months. Will treat it as a healthcare-associated pneumonia with vancomycin and cefepime. The patient also has a severe  bronchitis with bilateral wheezing. Continue with the breathing treatments and Solu-Medrol.  2. Temporal arteritis. Will hold the prednisone for now. Continue Solu-Medrol.  3. Hypertension. Currently well controlled. The patient is not on any medication.  4. Debility: Will involve physical therapy, occupational therapy.  5. Will put the patient on deep vein thrombosis prophylaxis with Lovenox.   TIME SPENT: 50 minutes.    ____________________________ Susa GriffinsPadmaja Vasireddy, MD pv:lm D: 01/09/2014 21:08:34 ET T: 01/10/2014 00:41:49 ET JOB#: [id number]  cc: Susa GriffinsPadmaja Vasireddy, MD, <Dictator> Susa GriffinsPADMAJA VASIREDDY MD ELECTRONICALLY SIGNED 01/20/2014 21:52

## 2014-11-22 DIAGNOSIS — J449 Chronic obstructive pulmonary disease, unspecified: Secondary | ICD-10-CM | POA: Diagnosis not present

## 2014-12-01 ENCOUNTER — Inpatient Hospital Stay
Admission: EM | Admit: 2014-12-01 | Discharge: 2014-12-03 | DRG: 378 | Disposition: A | Discharge status: Home / Self Care | Payer: Medicare Other | Attending: Internal Medicine | Admitting: Internal Medicine

## 2014-12-01 ENCOUNTER — Encounter: Payer: Self-pay | Admitting: *Deleted

## 2014-12-01 DIAGNOSIS — K922 Gastrointestinal hemorrhage, unspecified: Secondary | ICD-10-CM | POA: Diagnosis not present

## 2014-12-01 DIAGNOSIS — Z880 Allergy status to penicillin: Secondary | ICD-10-CM

## 2014-12-01 DIAGNOSIS — K219 Gastro-esophageal reflux disease without esophagitis: Secondary | ICD-10-CM | POA: Diagnosis present

## 2014-12-01 DIAGNOSIS — Z933 Colostomy status: Secondary | ICD-10-CM | POA: Diagnosis not present

## 2014-12-01 DIAGNOSIS — Z9889 Other specified postprocedural states: Secondary | ICD-10-CM | POA: Diagnosis not present

## 2014-12-01 DIAGNOSIS — K921 Melena: Secondary | ICD-10-CM | POA: Diagnosis present

## 2014-12-01 DIAGNOSIS — Z79899 Other long term (current) drug therapy: Secondary | ICD-10-CM | POA: Diagnosis not present

## 2014-12-01 DIAGNOSIS — I776 Arteritis, unspecified: Secondary | ICD-10-CM | POA: Diagnosis present

## 2014-12-01 DIAGNOSIS — Z7982 Long term (current) use of aspirin: Secondary | ICD-10-CM | POA: Diagnosis not present

## 2014-12-01 DIAGNOSIS — Z7952 Long term (current) use of systemic steroids: Secondary | ICD-10-CM | POA: Diagnosis not present

## 2014-12-01 DIAGNOSIS — Z809 Family history of malignant neoplasm, unspecified: Secondary | ICD-10-CM | POA: Diagnosis not present

## 2014-12-01 DIAGNOSIS — Z8701 Personal history of pneumonia (recurrent): Secondary | ICD-10-CM

## 2014-12-01 DIAGNOSIS — N179 Acute kidney failure, unspecified: Secondary | ICD-10-CM | POA: Diagnosis present

## 2014-12-01 DIAGNOSIS — Z66 Do not resuscitate: Secondary | ICD-10-CM | POA: Diagnosis present

## 2014-12-01 HISTORY — DX: Allergy, unspecified, initial encounter: T78.40XA

## 2014-12-01 HISTORY — DX: Essential (primary) hypertension: I10

## 2014-12-01 HISTORY — DX: Chronic kidney disease, unspecified: N18.9

## 2014-12-01 HISTORY — DX: Pneumonia, unspecified organism: J18.9

## 2014-12-01 HISTORY — DX: Age-related osteoporosis without current pathological fracture: M81.0

## 2014-12-01 HISTORY — DX: Unspecified osteoarthritis, unspecified site: M19.90

## 2014-12-01 LAB — CBC WITH DIFFERENTIAL/PLATELET
BASOS ABS: 0 10*3/uL (ref 0–0.1)
Basophils Relative: 1 %
Eosinophils Absolute: 0.1 10*3/uL (ref 0–0.7)
Eosinophils Relative: 2 %
HEMATOCRIT: 32 % — AB (ref 35.0–47.0)
HEMOGLOBIN: 10 g/dL — AB (ref 12.0–16.0)
Lymphocytes Relative: 23 %
Lymphs Abs: 1.2 10*3/uL (ref 1.0–3.6)
MCH: 26.2 pg (ref 26.0–34.0)
MCHC: 31.4 g/dL — ABNORMAL LOW (ref 32.0–36.0)
MCV: 83.6 fL (ref 80.0–100.0)
MONOS PCT: 10 %
Monocytes Absolute: 0.5 10*3/uL (ref 0.2–0.9)
NEUTROS ABS: 3.3 10*3/uL (ref 1.4–6.5)
Neutrophils Relative %: 64 %
Platelets: 192 10*3/uL (ref 150–440)
RBC: 3.83 MIL/uL (ref 3.80–5.20)
RDW: 14.2 % (ref 11.5–14.5)
WBC: 5.1 10*3/uL (ref 3.6–11.0)

## 2014-12-01 LAB — COMPREHENSIVE METABOLIC PANEL
ALK PHOS: 61 U/L (ref 38–126)
ALT: 11 U/L — AB (ref 14–54)
ANION GAP: 10 (ref 5–15)
AST: 22 U/L (ref 15–41)
Albumin: 3.7 g/dL (ref 3.5–5.0)
BUN: 40 mg/dL — AB (ref 6–20)
CHLORIDE: 101 mmol/L (ref 101–111)
CO2: 30 mmol/L (ref 22–32)
Calcium: 8.9 mg/dL (ref 8.9–10.3)
Creatinine, Ser: 1.78 mg/dL — ABNORMAL HIGH (ref 0.44–1.00)
GFR, EST AFRICAN AMERICAN: 25 mL/min — AB (ref >60)
GFR, EST NON AFRICAN AMERICAN: 22 mL/min — AB (ref >60)
Glucose, Bld: 97 mg/dL (ref 65–99)
Potassium: 4.8 mmol/L (ref 3.5–5.1)
Sodium: 141 mmol/L (ref 135–145)
Total Bilirubin: 0.5 mg/dL (ref 0.3–1.2)
Total Protein: 7.5 g/dL (ref 6.5–8.1)

## 2014-12-01 LAB — PROTIME-INR
INR: 1.01
Prothrombin Time: 13.5 seconds (ref 11.4–15.0)

## 2014-12-01 LAB — ABO/RH: ABO/RH(D): O POS

## 2014-12-01 LAB — TYPE AND SCREEN
ABO/RH(D): O POS
ANTIBODY SCREEN: NEGATIVE

## 2014-12-01 LAB — HEMOGLOBIN: Hemoglobin: 9.9 g/dL — ABNORMAL LOW (ref 12.0–16.0)

## 2014-12-01 LAB — OCCULT BLOOD X 1 CARD TO LAB, STOOL: Fecal Occult Bld: POSITIVE — AB

## 2014-12-01 LAB — APTT: aPTT: 26 seconds (ref 24–36)

## 2014-12-01 MED ORDER — ACETAMINOPHEN 325 MG PO TABS
650.0000 mg | ORAL_TABLET | Freq: Four times a day (QID) | ORAL | Status: DC | PRN
  Administered 2014-12-01 – 2014-12-02 (×2): 650 mg via ORAL
  Filled 2014-12-01 (×2): qty 2

## 2014-12-01 MED ORDER — RISPERIDONE 0.5 MG PO TBDP
0.5000 mg | ORAL_TABLET | Freq: Two times a day (BID) | ORAL | Status: DC
  Administered 2014-12-01: 0.5 mg via ORAL
  Filled 2014-12-01 (×2): qty 1

## 2014-12-01 MED ORDER — ACETAMINOPHEN 650 MG RE SUPP: 650.0000 mg | Freq: Four times a day (QID) | RECTAL | Status: DC | PRN

## 2014-12-01 MED ORDER — ALUM & MAG HYDROXIDE-SIMETH 200-200-20 MG/5ML PO SUSP: 30.0000 mL | Freq: Four times a day (QID) | ORAL | Status: DC | PRN

## 2014-12-01 MED ORDER — PANTOPRAZOLE SODIUM 40 MG IV SOLR
40.0000 mg | Freq: Two times a day (BID) | INTRAVENOUS | Status: DC
  Administered 2014-12-01 – 2014-12-03 (×4): 40 mg via INTRAVENOUS
  Filled 2014-12-01 (×4): qty 40

## 2014-12-01 MED ORDER — ONDANSETRON HCL 4 MG/2ML IJ SOLN: 4.0000 mg | Freq: Four times a day (QID) | INTRAMUSCULAR | Status: DC | PRN

## 2014-12-01 MED ORDER — PREDNISONE 1 MG PO TABS
5.0000 mg | ORAL_TABLET | Freq: Every day | ORAL | Status: DC
  Administered 2014-12-01: 5 mg via ORAL
  Filled 2014-12-01: qty 5

## 2014-12-01 MED ORDER — PREDNISONE 10 MG PO TABS: 10.0000 mg | ORAL_TABLET | Freq: Every day | ORAL | Status: DC

## 2014-12-01 MED ORDER — SODIUM CHLORIDE 0.9 % IV SOLN
INTRAVENOUS | Status: DC
  Administered 2014-12-02 (×2): via INTRAVENOUS

## 2014-12-01 MED ORDER — SENNOSIDES-DOCUSATE SODIUM 8.6-50 MG PO TABS
1.0000 | ORAL_TABLET | Freq: Every evening | ORAL | Status: DC | PRN
Start: 2014-12-01 — End: 2014-12-03

## 2014-12-01 MED ORDER — ONDANSETRON HCL 4 MG PO TABS: 4.0000 mg | ORAL_TABLET | Freq: Four times a day (QID) | ORAL | Status: DC | PRN

## 2014-12-01 MED ORDER — RISPERIDONE 0.5 MG PO TABS
0.2500 mg | ORAL_TABLET | Freq: Two times a day (BID) | ORAL | Status: DC
  Administered 2014-12-02 – 2014-12-03 (×3): 0.25 mg via ORAL
  Filled 2014-12-01 (×4): qty 1

## 2014-12-01 NOTE — Consult Note (Signed)
GI Inpatient Consult Note  Reason for Consult: Gastrointestinal Bleeding   Attending Requesting Consult: Dr. Adrian SaranSital Mody  History of Present Illness: Briana [name] is a [age] y.o. female with a history of a colostomy for approximately 70 years secondary to a colon infection.  She has been admitted secondary to dark loose stools and clots in her colostomy bag this morning.  She has no history of ulcers.  She is taking ASA daily and Prednisone 10 mgs daily for Temporal Arteritis.  Protonix 40 mg IV twice daily has been ordered. ASA on hold.    Past Medical History:  Past Medical History  Diagnosis Date  . Pneumonia   . Allergy   . Arthritis   . Hypertension   . Chronic kidney disease   . Osteoporosis     Problem List: Patient Active Problem List   Diagnosis Date Noted  . GIB (gastrointestinal bleeding) 12/01/2014    Past Surgical History: Past Surgical History  Procedure Laterality Date  . Colon surgery    . Bladder surgery    . Colostomy Left     Allergies: Allergies  Allergen Reactions  . Amoxicillin Diarrhea    Home Medications: Prescriptions prior to admission  Medication Sig Dispense Refill Last Dose  . acetaminophen (TYLENOL) 500 MG tablet Take 500-1,000 mg by mouth every 6 (six) hours as needed for mild pain or headache.   PRN at PRN  . aspirin EC 81 MG tablet Take 81 mg by mouth daily.   unknown at unknown  . Calcium Carbonate-Vitamin D (CALCIUM + D PO) Take 1 tablet by mouth daily.   unknown at unknown  . Cholecalciferol (VITAMIN D-3) 1000 UNITS CAPS Take 1 capsule by mouth daily.   unknown at unknown  . esomeprazole (NEXIUM) 20 MG capsule Take 20 mg by mouth daily.   unknown at unknown  . fluticasone (FLONASE) 50 MCG/ACT nasal spray Place 2 sprays into both nostrils daily as needed for allergies or rhinitis.   PRN at PRN  . furosemide (LASIX) 40 MG tablet Take 40 mg by mouth daily.   unknown at unknown  . Multiple Vitamins-Minerals (ZINC PO) Take 1 tablet by mouth  daily.   unknown at unknown  . predniSONE (DELTASONE) 5 MG tablet Take 5 mg by mouth daily.   unknown at unknown  . risperiDONE (RISPERDAL) 0.25 MG tablet Take 0.25 mg by mouth 2 (two) times daily.   unknown at unknown   . Vitamin D, Ergocalciferol, (DRISDOL) 50000 UNITS CAPS capsule Take 50,000 Units by mouth every 7 (seven) days.   unknown at unknown   Home medication reconciliation was completed with the patient.   Scheduled Inpatient Medications:   . pantoprazole (PROTONIX) IV  40 mg Intravenous Q12H  . [START ON 12/02/2014] predniSONE  10 mg Oral Q breakfast  . risperiDONE  0.5 mg Oral BID    Continuous Inpatient Infusions:   . sodium chloride      PRN Inpatient Medications:  acetaminophen **OR** acetaminophen, alum & mag hydroxide-simeth, ondansetron **OR** ondansetron (ZOFRAN) IV, senna-docusate  Family History: family history includes Cancer in her sister.  The patient's family history is negative for inflammatory bowel disorders, GI malignancy, or solid organ transplantation.  Social History:   reports that she has never smoked. She has quit using smokeless tobacco. She reports that she does not use illicit drugs. The patient denies ETOH, tobacco, or drug use.    Review of Systems: Constitutional: No complaints.  Eyes: No changes in vision. ENT:  No oral lesions, sore throat.  GI: see HPI.  Heme/Lymph: No easy bruising.  CV: No chest pain.  GU: No hematuria.  Integumentary: No rashes.  Neuro: History of Temporal arteritis. Takes chronic Prednisone.  Psych: Risperdal twice daily ( due to the effects of chronic Prednisone ) which helps her relax patient per granddaughter.  Endocrine: No heat/cold intolerance.  Allergic/Immunologic: No urticaria.  Resp: No cough, SOB.  Musculoskeletal: No joint swelling.      Physical Examination: BP 168/67 mmHg  Pulse 72  Temp(Src) 97.7 F (36.5 C) (Oral)  Resp 16  Ht 5\' 1"  (1.549 m)  Wt 67.132 kg (148 lb)  BMI 27.98 kg/m2   SpO2 98% Gen: NAD, alert and oriented to self, granddaughter and cousin. Patient able to tell me that she noticed blood in her ostomy bag on yesterday morning.  HEENT: sclera anicteric.   Neck: supple, no JVD or thyromegaly Chest: CTA bilaterally, no wheezes, crackles, or other adventitious sounds CV: RRR, no m/g/c/r Abd: soft, NT, ND, +BS in all four quadrants; no HSM, guarding, ridigity, or rebound tenderness. Left lower abdomen with colostomy. Pink stoma. Small amount of brown stool with streaks of red.  Ext: no edema, well perfused with 2+ pulses, Skin: no rash or lesions noted Lymph: no cervical adenopathy.  Data: Lab Results  Component Value Date   WBC 5.1 12/01/2014   HGB 10.0* 12/01/2014   HCT 32.0* 12/01/2014   MCV 83.6 12/01/2014   PLT 192 12/01/2014    Recent Labs Lab 12/01/14 1215  HGB 10.0*   Lab Results  Component Value Date   NA 141 12/01/2014   K 4.8 12/01/2014   CL 101 12/01/2014   CO2 30 12/01/2014   BUN 40* 12/01/2014   CREATININE 1.78* 12/01/2014   Lab Results  Component Value Date   ALT 11* 12/01/2014   AST 22 12/01/2014   ALKPHOS 61 12/01/2014   BILITOT 0.5 12/01/2014    Recent Labs Lab 12/01/14 1306  APTT 26  INR 1.01   Assessment/Plan: [name] is a [age] y.o. female with gastrointestinal bleeding. Blood and clots noted in her colostomy bag.  Etiology unclear.  Small amount of brown stool with streaks of red noted in her bag at my visit. Per discussion with Dr. Bluford Kaufmannh, our plan it to monitor her for now.   Recommendations:   Will obtain a CBC in the morning. Occult stool in ostomy bag this evening. Thank you for the consult. Please call with questions or concerns. Patient seen with Dr. Lutricia FeilPaul Oh under collaborative agreement.  Daryll Drownorine Quyen Cutsforth, MSN, FNP-BC

## 2014-12-01 NOTE — Consult Note (Signed)
  GI Inpatient Follow-up Note  Patient Identification: Briana [name] is a [age] y.o. female with bleeding via colostomy.  Subjective: Bleeding with blood clots via colostomy that was placed 70 years ago. Slight drop in hgb. Pt very HOH. Difficult to get good history. Please see D.Martin's notes. Pt had both EGD and colonoscopy in 2000. EGD was normal. Unfortunately, scope could not be advanced beyond 15cm due to sharp bend in the colon.  Scheduled Inpatient Medications:  . pantoprazole (PROTONIX) IV  40 mg Intravenous Q12H  . predniSONE  5 mg Oral Daily  . risperiDONE  0.25 mg Oral BID    Continuous Inpatient Infusions:   . sodium chloride      PRN Inpatient Medications:  acetaminophen **OR** acetaminophen, alum & mag hydroxide-simeth, ondansetron **OR** ondansetron (ZOFRAN) IV, senna-docusate  Review of Systems: Constitutional: Weight is stable.  Eyes: No changes in vision. ENT: No oral lesions, sore throat.  GI: see HPI.  Heme/Lymph: No easy bruising.  CV: No chest pain.  GU: No hematuria.  Integumentary: No rashes.  Neuro: No headaches.  Psych: No depression/anxiety.  Endocrine: No heat/cold intolerance.  Allergic/Immunologic: No urticaria.  Resp: No cough, SOB.  Musculoskeletal: No joint swelling.    Physical Examination: BP 168/67 mmHg  Pulse 72  Temp(Src) 97.7 F (36.5 C) (Oral)  Resp 16  Ht 5\' 1"  (1.549 m)  Wt 67.132 kg (148 lb)  BMI 27.98 kg/m2  SpO2 98% Gen: NAD, alert and oriented x 4 HEENT: PEERLA, EOMI, Neck: supple, no JVD or thyromegaly Chest: CTA bilaterally, no wheezes, crackles, or other adventitious sounds CV: RRR, no m/g/c/r Abd: colostomy bag in place. No bleeding at entrance site.  Soft, NT, ND, +BS in all four quadrants; no HSM, guarding, ridigity, or rebound tenderness Ext: no edema, well perfused with 2+ pulses, Skin: no rash or lesions noted Lymph: no LAD  Data: Lab Results  Component Value Date   WBC 5.1 12/01/2014   HGB 9.9*  12/01/2014   HCT 32.0* 12/01/2014   MCV 83.6 12/01/2014   PLT 192 12/01/2014    Recent Labs Lab 12/01/14 1215 12/01/14 1949  HGB 10.0* 9.9*   Lab Results  Component Value Date   NA 141 12/01/2014   K 4.8 12/01/2014   CL 101 12/01/2014   CO2 30 12/01/2014   BUN 40* 12/01/2014   CREATININE 1.78* 12/01/2014   Lab Results  Component Value Date   ALT 11* 12/01/2014   AST 22 12/01/2014   ALKPHOS 61 12/01/2014   BILITOT 0.5 12/01/2014    Recent Labs Lab 12/01/14 1306  APTT 26  INR 1.01   Assessment/Plan: [name] is a 81 y.o. female with GI bleeding. Though pt is bASA/prednisone, hx suggests more LGI bleeding. Due to prior hx of attempted colonoscopy, would not recommend repeating colonoscopy.  Even EGD may be risk due to her advanced age.  Recommendations: For now, continue to moniter CBC. PPI daily.   Please call with questions or concerns.  Giovany Cosby, Ezzard StandingPAUL Y, MD

## 2014-12-01 NOTE — Plan of Care (Signed)
Problem: Discharge Progression Outcomes Goal: Other Discharge Outcomes/Goals Outcome: Progressing Plan of Care Progress to Goal: Pt noted blood clots in colostomy bag.  Will get occult blood specimen.  Hgb is 10.  Fluids running.  On clear liquid diet. Crush meds.

## 2014-12-01 NOTE — ED Provider Notes (Signed)
[facility] Emergency Department Provider Note  ____________________________________________  Time seen: Approximately 12:16 PM  I have reviewed the triage vital signs and the nursing notes.   HISTORY  Chief Complaint No chief complaint on file.   HPI Briana [name] is a [age] y.o. female with a history of a colostomy done 70 years ago who presents today with 1 episode of bleeding into the colostomy bag. The family says that the colostomy was done for "colonic infection."  The patient fell about half her colostomy bag today with bright red clots. The patient denies any pain or any medical complaints at the time of this interview. The bag was emptied prior to arrival and she has not had any further episodes. She has not been feeling ill lately. Family does not know if patient has a Careers advisersurgeon or GI specialist that follows her for her colostomy.   Past Medical History  Diagnosis Date  . Pneumonia     There are no active problems to display for this patient.   Past Surgical History  Procedure Laterality Date  . Colon surgery    . Bladder surgery    . Colostomy Left     No current outpatient prescriptions on file.  Allergies Amoxicillin  No family history on file.  Social History History  Substance Use Topics  . Smoking status: Never Smoker   . Smokeless tobacco: Not on file  . Alcohol Use: No    Review of Systems Constitutional: No fever/chills Eyes: No visual changes. ENT: No sore throat. Cardiovascular: Denies chest pain. Respiratory: Denies shortness of breath. Gastrointestinal: No abdominal pain.  No nausea, no vomiting.  No diarrhea.  No constipation. Genitourinary: Negative for dysuria. Musculoskeletal: Negative for back pain. Skin: Negative for rash. Neurological: Negative for headaches, focal weakness or numbness.  10-point ROS otherwise negative.  ____________________________________________   PHYSICAL EXAM:  VITAL SIGNS: ED  Triage Vitals  Enc Vitals Group     BP 12/01/14 1136 144/56 mmHg     Pulse Rate 12/01/14 1136 70     Resp --      Temp 12/01/14 1136 98 F (36.7 C)     Temp Source 12/01/14 1136 Oral     SpO2 12/01/14 1136 99 %     Weight 12/01/14 1136 148 lb (67.132 kg)     Height 12/01/14 1136 5\' 1"  (1.549 m)     Head Cir --      Peak Flow --      Pain Score --      Pain Loc --      Pain Edu? --      Excl. in GC? --     Constitutional: Alert and oriented. Well appearing and in no acute distress. Eyes: Pale sclera. PeRRL. EOMI. Head: Atraumatic. Nose: No congestion/rhinnorhea. Mouth/Throat: Mucous membranes are moist.  Oropharynx non-erythematous. Neck: No stridor.    Cardiovascular: Normal rate, regular rhythm. Grossly normal heart sounds.  Good peripheral circulation. Respiratory: Normal respiratory effort.  No retractions. Lungs CTAB. wears nasal cannula oxygen. This is her chronic nasal cannula O2. Gastrointestinal: Soft and nontender. No distention. No abdominal bruits. colostomy examined and bag is empty. There is no erythema or induration around the colostomy site. Colostomy exam done with small finger. Retrieved a small amount of dark, maroon to black stool that was strongly heme positive Musculoskeletal: No lower extremity tenderness nor edema.  No joint effusions. Neurologic:  Normal speech and language. No gross focal neurologic deficits are appreciated. Speech is  normal. No gait instability. Skin:  Skin is warm, dry and intact. No rash noted. Psychiatric: Mood and affect are normal. Speech and behavior are normal.  ____________________________________________   LABS (all labs ordered are listed, but only abnormal results are displayed)  Labs Reviewed  CBC WITH DIFFERENTIAL/PLATELET - Abnormal; Notable for the following:    Hemoglobin 10.0 (*)    HCT 32.0 (*)    MCHC 31.4 (*)    All other components within normal limits  COMPREHENSIVE METABOLIC PANEL - Abnormal; Notable for the  following:    BUN 40 (*)    Creatinine, Ser 1.78 (*)    ALT 11 (*)    GFR calc non Af Amer 22 (*)    GFR calc Af Amer 25 (*)    All other components within normal limits  PROTIME-INR  APTT  TYPE AND SCREEN  ABO/RH   ____________________________________________  EKG   ____________________________________________  RADIOLOGY   ____________________________________________   PROCEDURES  Procedure(s) performed: None  Critical Care performed: No  ____________________________________________   INITIAL IMPRESSION / ASSESSMENT AND PLAN / ED COURSE  Pertinent labs & imaging results that were available during my care of the patient were reviewed by me and considered in my medical decision making (see chart for details).  ----------------------------------------- 1:56 PM on 12/01/2014 -----------------------------------------  Patient reevaluated and resting comfortably. Hemoglobin low but higher than last recorded. Did not have any further bleeding per her colostomy. Will admit to the hospital overnight for observation and hemoglobin checks. Rectal exam showing dark strongly heme-positive stool. ____________________________________________   FINAL CLINICAL IMPRESSION(S) / ED DIAGNOSES  GI bleed    Myrna Blazeravid Matthew Schaevitz, MD 12/01/14 1359

## 2014-12-01 NOTE — Plan of Care (Signed)
Problem: Discharge Progression Outcomes Goal: Discharge plan in place and appropriate Individualization: Goes by Briana [name] - Lives at home by herself - son stays w/her at night.  Caregiver during day.  Pt has had a colostomy for 70 yrs and Usually takes care of it herself.  Likes to sit up in wheel chair b/c of chronic back pain.  Very hard of hearing.  High fall risk.  [facility] And can let us know when she needs to go.  She may have confusion tonight being in a strange place.  Granddaughter is employee in ED.

## 2014-12-01 NOTE — H&P (Addendum)
[facility] [place] [facility]   PATIENT NAME: Briana [name]    MR#:  [id number]  DATE OF BIRTH:  28-Oct-1907  DATE OF ADMISSION:  12/01/2014  PRIMARY CARE PHYSICIAN: Mimi McGlaucin   REQUESTING/REFERRING PHYSICIAN: Dr. Langston MaskerShaevitz  CHIEF COMPLAINT:  Dark-colored stool and clots in colostomy bag HISTORY OF PRESENT ILLNESS:  Briana Blendlene Star  is a 81 y.o. female with a known history of colostomy approximate 70 years ago and temporal arteritis on chronic prednisone who presents with above complaint. Family noted that the patient had dark loose stools and clots in her colostomy bag this morning. She was brought to the ER for further evaluation. Patient is on aspirin daily. Patient has no history of ulcers. She had a colostomy about 70 years ago for colon infection. She is also on prednisone date daily for temporal arteritis. Patient denies any dizziness or chest pain.  PAST MEDICAL HISTORY:   Past Medical History  Diagnosis Date  . Pneumonia    temporal arteritis GERD  PAST SURGICAL HISTOIRY:   Past Surgical History  Procedure Laterality Date  . Colon surgery    . Bladder surgery    . Colostomy Left     SOCIAL HISTORY:   History  Substance Use Topics  . Smoking status: Never Smoker   . Smokeless tobacco: Not on file  . Alcohol Use: No    FAMILY HISTORY:  No family history of CAD, hypertension or strokes  DRUG ALLERGIES:   Allergies  Allergen Reactions  . Amoxicillin Diarrhea    REVIEW OF SYSTEMS:  CONSTITUTIONAL: No fever, fatigue or weakness.  EYES: No blurred or double vision.  EARS, NOSE, AND THROAT: No tinnitus or ear pain.  RESPIRATORY: No cough, shortness of breath, wheezing or hemoptysis.  CARDIOVASCULAR: No chest pain, orthopnea, edema.  GASTROINTESTINAL: No nausea, vomiting, diarrhea or abdominal pain. Positive dark-colored stools with clots in her colostomy bag GENITOURINARY: No dysuria, hematuria.  ENDOCRINE: No polyuria,  nocturia,  HEMATOLOGY: No anemia, easy bruising or bleeding SKIN: No rash or lesion. MUSCULOSKELETAL: No joint pain or arthritis.  She walks with a walker NEUROLOGIC: No tingling, numbness, weakness.  PSYCHIATRY: No anxiety or depression.   MEDICATIONS AT HOME:  Prednisone 10 mg daily Lasix 40 Mg daily Risperdal 0.5 mg by mouth twice a day Vitamin D3 daily   aspirin 81 mg daily   Nexium 20 mg daily       VITAL SIGNS:  Blood pressure 153/55, pulse 65, temperature 98 F (36.7 C), temperature source Oral, resp. rate 16, height 5\' 1"  (1.549 m), weight 67.132 kg (148 lb), SpO2 99 %.  PHYSICAL EXAMINATION:  GENERAL:  81 y.o.-year-old patient lying in the bed with no acute distress. She looks much younger than stated age EYES: Pupils equal, round, reactive to light and accommodation. No scleral icterus. Extraocular muscles intact.  HEENT: Head atraumatic, normocephalic. Oropharynx and nasopharynx clear.  NECK:  Supple, no jugular venous distention. No thyroid enlargement, no tenderness.  LUNGS: Normal breath sounds bilaterally, no wheezing, rales,rhonchi or crepitation. No use of accessory muscles of respiration.  CARDIOVASCULAR: S1, S2 normal. No murmurs, rubs, or gallops.  ABDOMEN: Soft, nontender, nondistended. Bowel sounds present. No organomegaly or mass. She has a colostomy bag at this time without any stool EXTREMITIES: No pedal edema, cyanosis, or clubbing.  NEUROLOGIC: Cranial nerves II through XII are intact. Muscle strength 5/5 in all extremities.  PSYCHIATRIC: The patient is alert and oriented x 3.  SKIN: No obvious rash, lesion,  or ulcer.   LABORATORY PANEL:   CBC  Recent Labs Lab 12/01/14 1215  WBC 5.1  HGB 10.0*  HCT 32.0*  PLT 192   ------------------------------------------------------------------------------------------------------------------  Chemistries   Recent Labs Lab 12/01/14 1215  NA 141  K 4.8  CL 101  CO2 30  GLUCOSE 97  BUN 40*   CREATININE 1.78*  CALCIUM 8.9  AST 22  ALT 11*  ALKPHOS 61  BILITOT 0.5   ------------------------------------------------------------------------------------------------------------------  Cardiac Enzymes No results for input(s): TROPONINI in the last 168 hours. ------------------------------------------------------------------------------------------------------------------  RADIOLOGY:  No results found.  EKG:   No EKG IMPRESSION AND PLAN:  This is 81 year old female with history of temporal arteritis on chronic prednisone and code lost to me who presents with dark-colored stools in her colostomy bag.  1 GI bleed: Patient will be admitted to the hospital service. I have consulted GI for further evaluation and treatment. Patient will be placed on Protonix IV 40 twice a day. I will hold aspirin. I will check a hemoglobin later this evening. Further recommendations after GI consultation.  2. Temporal arteritis: Patient is on chronic prednisone which I will continue. She is also on Risperdal due to the effects of prednisone. I'll continue prednisone.  3. GERD: Patient is on Nexium which I'm holding for now and she will be on Protonix instead.   4. Acute kidney injury: It is noted patient is on Lasix. I am holding Lasix. I will provide gentle hydration and follow creatinine in the a.m.  All the records are reviewed and case discussed with ED provider. Management plans discussed with the patient and family and they are in agreement.  CODE STATUS: DO NOT RESUSCITATE TOTAL TIME TAKING CARE OF THIS PATIENT:  45  minutes.    Imaad Reuss M.D on 12/01/2014 at 2:37 PM  Between 7am to 6pm - Pager - [phone] After 6pm go to [email or web address] - password [email or web address] [facility]  [facility] [place]  Office  [phone]  CC: Primary care physician; Marguarite ArbourSPARKS,JEFFREY D, MD [facility]

## 2014-12-01 NOTE — ED Notes (Signed)
Went to bathroom today and noticed a lot of blood in colostomy

## 2014-12-02 LAB — CBC
HCT: 30 % — ABNORMAL LOW (ref 35.0–47.0)
HEMOGLOBIN: 9.6 g/dL — AB (ref 12.0–16.0)
MCH: 26.7 pg (ref 26.0–34.0)
MCHC: 31.9 g/dL — ABNORMAL LOW (ref 32.0–36.0)
MCV: 83.8 fL (ref 80.0–100.0)
PLATELETS: 172 10*3/uL (ref 150–440)
RBC: 3.58 MIL/uL — AB (ref 3.80–5.20)
RDW: 14.5 % (ref 11.5–14.5)
WBC: 5.8 10*3/uL (ref 3.6–11.0)

## 2014-12-02 MED ORDER — PREDNISONE 5 MG PO TABS
2.5000 mg | ORAL_TABLET | Freq: Every day | ORAL | Status: DC
  Administered 2014-12-03: 2.5 mg via ORAL
  Filled 2014-12-02 (×2): qty 0.5

## 2014-12-02 NOTE — Consult Note (Signed)
GI Inpatient Follow-up Note  Patient Identification: Briana [name] is a [age] y.o. female with GI bleeding.   Subjective: No complaints. NPO now. No gross bleeding since yesterday. On protonix iv. Stool heme positive. CBC stable. Scheduled Inpatient Medications:  . pantoprazole (PROTONIX) IV  40 mg Intravenous Q12H  . predniSONE  5 mg Oral Daily  . risperiDONE  0.25 mg Oral BID    Continuous Inpatient Infusions:   . sodium chloride      PRN Inpatient Medications:  acetaminophen **OR** acetaminophen, alum & mag hydroxide-simeth, ondansetron **OR** ondansetron (ZOFRAN) IV, senna-docusate  Review of Systems: Constitutional: Weight is stable.  Eyes: No changes in vision. ENT: No oral lesions, sore throat.  GI: see HPI.  Heme/Lymph: No easy bruising.  CV: No chest pain.  GU: No hematuria.  Integumentary: No rashes.  Neuro: No headaches.  Psych: No depression/anxiety.  Endocrine: No heat/cold intolerance.  Allergic/Immunologic: No urticaria.  Resp: No cough, SOB.  Musculoskeletal: No joint swelling.    Physical Examination: BP 129/51 mmHg  Pulse 55  Temp(Src) 98.1 F (36.7 C) (Oral)  Resp 18  Ht 5\' 1"  (1.549 m)  Wt 67.132 kg (148 lb)  BMI 27.98 kg/m2  SpO2 100% Gen: NAD, alert and oriented x 4 HEENT: PEERLA, EOMI, Neck: supple, no JVD or thyromegaly Chest: CTA bilaterally, no wheezes, crackles, or other adventitious sounds CV: RRR, no m/g/c/r Abd: soft, NT, ND, +BS in all four quadrants; no HSM, guarding, ridigity, or rebound tenderness; colostomy intact. No blood per colostomy. Ext: no edema, well perfused with 2+ pulses, Skin: no rash or lesions noted Lymph: no LAD  Data: Lab Results  Component Value Date   WBC 5.8 12/02/2014   HGB 9.6* 12/02/2014   HCT 30.0* 12/02/2014   MCV 83.8 12/02/2014   PLT 172 12/02/2014    Recent Labs Lab 12/01/14 1215 12/01/14 1949 12/02/14 0530  HGB 10.0* 9.9* 9.6*   Lab Results  Component Value Date   NA 141 12/01/2014    K 4.8 12/01/2014   CL 101 12/01/2014   CO2 30 12/01/2014   BUN 40* 12/01/2014   CREATININE 1.78* 12/01/2014   Lab Results  Component Value Date   ALT 11* 12/01/2014   AST 22 12/01/2014   ALKPHOS 61 12/01/2014   BILITOT 0.5 12/01/2014    Recent Labs Lab 12/01/14 1306  APTT 26  INR 1.01   Assessment/Plan: Briana [name] is a [age] y.o. female with GI bleeding. None since admission.    Recommendations: Full liquid diet ordered. Continue protonix bid for now. Advance to solids tomorrow if no further bleeding. See my notes from yesterday. No plans for endoscopies at this time unless pt has active bleeding. thanks Please call with questions or concerns.  Lela Gell, Ezzard StandingPAUL Y, MD

## 2014-12-02 NOTE — Progress Notes (Signed)
Chaplain met with patient and caregiver, chaplain offered prayer and encouragement. Briana [name] Thursday 12-02-2014   12/02/14 2010  Clinical Encounter Type  Visited With Health care provider;Patient  Visit Type Initial  Referral From Nurse  Consult/Referral To Chaplain  Spiritual Encounters  Spiritual Needs Prayer;Emotional  Stress Factors  Patient Stress Factors Health changes  Family Stress Factors Health changes

## 2014-12-02 NOTE — Plan of Care (Signed)
Problem: Discharge Progression Outcomes Goal: Discharge plan in place and appropriate Individualization Outcome: Progressing Individualization:  Goes by Bridey - Lives at home by herself - son stays w/her at night.  Caregiver during day.  Pt has had a colostomy for 70 yrs and Usually takes care of it herself.  Likes to sit up in wheel chair b/c of chronic back pain.  Very hard of hearing.  High fall risk.  [facility] And can let us know when she needs to go.  Confusion at times due to new enviornment.  Granddaughter is employee in ED.  Goal: Other Discharge Outcomes/Goals Outcome: Progressing Progression to goal: Pt and family noted blood in colostomy bag. Occult blood test came back positive,  Hgb is 9.9  Fluids running. Pt on clear liquid diet. Complained of back pain once, tylenol given with relief.

## 2014-12-02 NOTE — Plan of Care (Signed)
Problem: Discharge Progression Outcomes Goal: Discharge plan in place and appropriate Individualization  Individualization of Care:   Goes by Briana [name] - Lives at home by herself - son stays w/her at night.  Caregiver during day.  Pt has had a colostomy for 70 yrs and Usually takes care of it herself.  Likes to sit up in wheel chair b/c of chronic back pain.  Very hard of hearing.  High fall risk.  [facility] And can let us know when she needs to go.  Confusion at times due to new environment.  Granddaughter is employee in ED.       Goal: Other Discharge Outcomes/Goals Plan of care progress to goal:  NS at 75 ml/hr infusing. Clear liquid diet continues. [facility] Colostomy bag changed. Caregiver at bedside. Tylenol given once for pain, relief noted.

## 2014-12-02 NOTE — Care Management (Signed)
This RNCM attempted to meet with patient but she was resting/sleeping and I did not wake her. Per RN she is from home with 24/7 care, alert and oriented, self-care with established colostomy, has walker for ambulation, but NEW O2. RNCM to continue to follow for O2 and discharge needs.

## 2014-12-02 NOTE — Progress Notes (Signed)
[facility] [place] [facility]   PATIENT NAME: Briana [name]    MR#:  [id number]  [facility]  04/12/1908  SUBJECTIVE:  CHIEF COMPLAINT:  Blood in stool   REVIEW OF SYSTEMS:  CONSTITUTIONAL: No fever, fatigue or weakness.  EYES: No blurred or double vision.  EARS, NOSE, AND THROAT: No tinnitus or ear pain.  RESPIRATORY: No cough, shortness of breath, wheezing or hemoptysis.  CARDIOVASCULAR: No chest pain, orthopnea, edema.  GASTROINTESTINAL: No nausea, vomiting, diarrhea or abdominal pain.  GENITOURINARY: No dysuria, hematuria.  ENDOCRINE: No polyuria, nocturia,  HEMATOLOGY: No anemia, easy bruising or bleeding SKIN: No rash or lesion. MUSCULOSKELETAL: No joint pain or arthritis.   NEUROLOGIC: No tingling, numbness, weakness.  PSYCHIATRY: No anxiety or depression.   ROS  DRUG ALLERGIES:   Allergies  Allergen Reactions  . Amoxicillin Diarrhea    VITALS:  Blood pressure 129/51, pulse 55, temperature 98.1 F (36.7 C), temperature source Oral, resp. rate 18, height 5\' 1"  (1.549 m), weight 67.132 kg (148 lb), SpO2 100 %.  PHYSICAL EXAMINATION:  GENERAL:  [age] y.o.-year-old patient lying in the bed with no acute distress.  EYES: Pupils equal, round, reactive to light and accommodation. No scleral icterus. Extraocular muscles intact. conjunctiva pale HEENT: Head atraumatic, normocephalic. Oropharynx and nasopharynx clear.  NECK:  Supple, no jugular venous distention. No thyroid enlargement, no tenderness.  LUNGS: Normal breath sounds bilaterally, no wheezing, rales,rhonchi or crepitation. No use of accessory muscles of respiration.  CARDIOVASCULAR: S1, S2 normal. No murmurs, rubs, or gallops.  ABDOMEN: Soft, nontender, nondistended. Bowel sounds present. No organomegaly or mass. Colostomy bag in place. EXTREMITIES: No pedal edema, cyanosis, or clubbing.  NEUROLOGIC: Cranial nerves II through XII are intact. Hearing deficit. Muscle strength 4/5 in  all extremities. Generalized weakness. Sensation intact. Gait not checked.  PSYCHIATRIC: The patient is alert and oriented x 3.  SKIN: No obvious rash, lesion, or ulcer.   Physical Exam LABORATORY PANEL:   CBC  Recent Labs Lab 12/02/14 0530  WBC 5.8  HGB 9.6*  HCT 30.0*  PLT 172   ------------------------------------------------------------------------------------------------------------------  Chemistries   Recent Labs Lab 12/01/14 1215  NA 141  K 4.8  CL 101  CO2 30  GLUCOSE 97  BUN 40*  CREATININE 1.78*  CALCIUM 8.9  AST 22  ALT 11*  ALKPHOS 61  BILITOT 0.5    ASSESSMENT AND PLAN:   This is 81 year old female with history of temporal arteritis on chronic prednisone and colostomy who presents with dark-colored stools in her colostomy bag.  1 GI bleed:  appreciated GI consult- no further workups, if no severe bleeding.  on Protonix IV 40 twice a day. hold aspirin.Hb stable.  2. Temporal arteritis: Patient is on chronic prednisone,continue. Risperdal due to the effects of prednisone.  3. GERD: on Protonix instead.   4. Acute kidney injury:  holding Lasix. provide gentle hydration and follow creatinine in the a.m.  All the records are reviewed and case discussed with Care Management/Social Workerr. Management plans discussed with the patient, family and they are in agreement.  CODE STATUS: DNR TOTAL TIME TAKING CARE OF THIS PATIENT: 35 minutes.   POSSIBLE D/C IN 1-2 DAYS, DEPENDING ON CLINICAL CONDITION.   Altamese DillingVACHHANI, Jequan Shahin M.D on 12/02/2014   Between 7am to 6pm - Pager - [phone]  After 6pm go to [email or web address] - password [email or web address]  [facility] [place] [facility]  Office  [phone]  CC: Primary care physician; Marguarite ArbourSPARKS,JEFFREY D, MD

## 2014-12-03 LAB — CBC
HCT: 28.6 % — ABNORMAL LOW (ref 35.0–47.0)
HEMOGLOBIN: 9 g/dL — AB (ref 12.0–16.0)
MCH: 26.4 pg (ref 26.0–34.0)
MCHC: 31.5 g/dL — ABNORMAL LOW (ref 32.0–36.0)
MCV: 83.9 fL (ref 80.0–100.0)
Platelets: 166 10*3/uL (ref 150–440)
RBC: 3.41 MIL/uL — AB (ref 3.80–5.20)
RDW: 14.1 % (ref 11.5–14.5)
WBC: 4.7 10*3/uL (ref 3.6–11.0)

## 2014-12-03 MED ORDER — PANTOPRAZOLE SODIUM 40 MG PO TBEC: 40.0000 mg | DELAYED_RELEASE_TABLET | Freq: Two times a day (BID) | ORAL | Status: DC

## 2014-12-03 MED ORDER — FERROUS SULFATE 325 (65 FE) MG PO TABS: 325.0000 mg | ORAL_TABLET | Freq: Two times a day (BID) | ORAL | Status: AC

## 2014-12-03 NOTE — Discharge Summary (Signed)
[facility] [place] at [facility]   PATIENT NAME: Briana [name]    MR#:  [id number]  DATE OF BIRTH:  [birth year]  DATE OF ADMISSION:  12/01/2014 ADMITTING PHYSICIAN: Adrian SaranSital Mody, MD  DATE OF DISCHARGE: 12/03/2014  PRIMARY CARE PHYSICIAN: SPARKS,JEFFREY D, MD    ADMISSION DIAGNOSIS:  Acute GI bleeding [K92.2]  DISCHARGE DIAGNOSIS:  Active Problems:   GIB (gastrointestinal bleeding)   SECONDARY DIAGNOSIS:   Past Medical History  Diagnosis Date  . Pneumonia   . Allergy   . Arthritis   . Hypertension   . Chronic kidney disease   . Osteoporosis     HOSPITAL COURSE:   This is 81 year old female with history of temporal arteritis on chronic prednisone and colostomy who presents with dark-colored stools in her colostomy bag.  1 GI bleed: appreciated GI consult- no further workups, if no severe bleeding.  on Protonix IV 40 twice a day. hold aspirin.Hb stable.  Hb remained stable, and she tolerated regular diet- so d/c home.  I spoke to her Briana MoutonGrand daughter ( who works in [facility])- about folow up appointment in 2 weeks with PMD.    If have more bleed - need to come to ER or call PMD.  2. Temporal arteritis: Patient is on chronic prednisone,continue.  3. GERD: on Protonix instead.   4. Acute kidney injury: holding Lasix. provide gentle hydration and follow creatinine in the a.m. Stable.  DISCHARGE CONDITIONS:   Stable.  CONSULTS OBTAINED:  Treatment Team:  Wallace CullensPaul Y Oh, MD  DRUG ALLERGIES:   Allergies  Allergen Reactions  . Amoxicillin Diarrhea    DISCHARGE MEDICATIONS:   Current Discharge Medication List    START taking these medications   Details  ferrous sulfate (FERROUSUL) 325 (65 FE) MG tablet Take 1 tablet (325 mg total) by mouth 2 (two) times daily with a meal. Qty: 60 tablet, Refills: 3    pantoprazole (PROTONIX) 40 MG tablet Take 1 tablet (40 mg total) by mouth 2 (two) times daily. Qty: 60 tablet, Refills: 0       CONTINUE these medications which have NOT CHANGED   Details  acetaminophen (TYLENOL) 500 MG tablet Take 500-1,000 mg by mouth every 6 (six) hours as needed for mild pain or headache.    Calcium Carbonate-Vitamin D (CALCIUM + D PO) Take 1 tablet by mouth daily.    Cholecalciferol (VITAMIN D-3) 1000 UNITS CAPS Take 1 capsule by mouth daily.    fluticasone (FLONASE) 50 MCG/ACT nasal spray Place 2 sprays into both nostrils daily as needed for allergies or rhinitis.    furosemide (LASIX) 40 MG tablet Take 40 mg by mouth daily.    Multiple Vitamins-Minerals (ZINC PO) Take 1 tablet by mouth daily.    predniSONE (DELTASONE) 5 MG tablet Take 5 mg by mouth daily.    risperiDONE (RISPERDAL) 0.25 MG tablet Take 0.25 mg by mouth 2 (two) times daily.    Vitamin D, Ergocalciferol, (DRISDOL) 50000 UNITS CAPS capsule Take 50,000 Units by mouth every 7 (seven) days.      STOP taking these medications     aspirin EC 81 MG tablet      esomeprazole (NEXIUM) 20 MG capsule          DISCHARGE INSTRUCTIONS:    Follow with PMD in 2 weeks to check CBC.  If you experience worsening of your admission symptoms, develop shortness of breath, life threatening emergency, suicidal or homicidal thoughts you must seek medical attention immediately by calling  911 or calling your MD immediately  if symptoms less severe.  You Must read complete instructions/literature along with all the possible adverse reactions/side effects for all the Medicines you take and that have been prescribed to you. Take any new Medicines after you have completely understood and accept all the possible adverse reactions/side effects.   Please note  You were cared for by a hospitalist during your hospital stay. If you have any questions about your discharge medications or the care you received while you were in the hospital after you are discharged, you can call the unit and asked to speak with the hospitalist on call if the  hospitalist that took care of you is not available. Once you are discharged, your primary care physician will handle any further medical issues. Please note that NO REFILLS for any discharge medications will be authorized once you are discharged, as it is imperative that you return to your primary care physician (or establish a relationship with a primary care physician if you do not have one) for your aftercare needs so that they can reassess your need for medications and monitor your lab values.    Today   CHIEF COMPLAINT:  No chief complaint on file.   HISTORY OF PRESENT ILLNESS:  Briana [name]  is a 81 y.o. female with a known history of colostomy approximate 70 years ago and temporal arteritis on chronic prednisone who presents with above complaint. Family noted that the patient had dark loose stools and clots in her colostomy bag this morning. She was brought to the ER for further evaluation. Patient is on aspirin daily. Patient has no history of ulcers. She had a colostomy about 70 years ago for colon infection. She is also on prednisone date daily for temporal arteritis. Patient denies any dizziness or chest pain.  VITAL SIGNS:  Blood pressure 142/59, pulse 72, temperature 98.9 F (37.2 C), temperature source Oral, resp. rate 16, height  (1.549 m), weight 67.132 kg (148 lb), SpO2 96 %.  I/O:   Intake/Output Summary (Last 24 hours) at 12/03/14 1455 Last data filed at 12/03/14 1200  Gross per 24 hour  Intake 1806.25 ml  Output   1450 ml  Net 356.25 ml    PHYSICAL EXAMINATION:  GENERAL: 81 y.o.-year-old patient lying in the bed with no acute distress.  EYES: Pupils equal, round, reactive to light and accommodation. No scleral icterus. Extraocular muscles intact. conjunctiva pale HEENT: Head atraumatic, normocephalic. Oropharynx and nasopharynx clear. hearing deficit. NECK: Supple, no jugular venous distention. No thyroid enlargement, no tenderness.  LUNGS: Normal breath  sounds bilaterally, no wheezing, rales,rhonchi or crepitation. No use of accessory muscles of respiration.  CARDIOVASCULAR: S1, S2 normal. No murmurs, rubs, or gallops.  ABDOMEN: Soft, nontender, nondistended. Bowel sounds present. No organomegaly or mass. Colostomy bag in place. EXTREMITIES: No pedal edema, cyanosis, or clubbing.  NEUROLOGIC: Cranial nerves II through XII are intact. Hearing deficit. Muscle strength 4/5 in all extremities. Generalized weakness. Sensation intact. Gait not checked.  PSYCHIATRIC: The patient is alert and oriented x 3.  SKIN: No obvious rash, lesion, or ulcer.   DATA REVIEW:   CBC  Recent Labs Lab 12/03/14 0502  WBC 4.7  HGB 9.0*  HCT 28.6*  PLT 166    Chemistries   Recent Labs Lab 12/01/14 1215  NA 141  K 4.8  CL 101  CO2 30  GLUCOSE 97  BUN 40*  CREATININE 1.78*  CALCIUM 8.9  AST 22  ALT 11*  ALKPHOS  61  BILITOT 0.5    Cardiac Enzymes No results for input(s): TROPONINI in the last 168 hours.  Microbiology Results  Results for orders placed or performed in visit on 01/09/14  Culture, blood (single)     Status: None   Collection Time: 01/09/14  9:32 AM  Result Value Ref Range Status   Micro Text Report   Final       COMMENT                   NO GROWTH AEROBICALLY/ANAEROBICALLY IN 5 DAYS   ANTIBIOTIC                                                      Culture, blood (single)     Status: None   Collection Time: 01/09/14  7:32 PM  Result Value Ref Range Status   Micro Text Report   Final       COMMENT                   NO GROWTH AEROBICALLY/ANAEROBICALLY IN 5 DAYS   ANTIBIOTIC                                                        Management plans discussed with the patient, family and they are in agreement.  CODE STATUS: DNR   TOTAL TIME TAKING CARE OF THIS PATIENT: 40 minutes.    Altamese DillingVACHHANI, Elleanna Melling M.D on 12/03/2014 at 2:55 PM  Between 7am to 6pm - Pager - [phone]  After 6pm go to [email or web address]  - password [email or web address] [facility]  [facility]  Office  [phone]  CC: Primary care physician; Marguarite ArbourSPARKS,JEFFREY D, MD

## 2014-12-03 NOTE — Care Management Note (Signed)
Case Management Note  Patient Details  Name: Briana [name] MRN: [id number] Date of Birth: 1908-05-12  Subjective/Objective:   Clarification. Pt currently uses O2 @ 2L at home. She has private sitters in the home. No additional needs identified.                  Action/Plan:   Expected Discharge Date:  12/04/14               Expected Discharge Plan:  Home/Self Care  In-House Referral:     Discharge planning Services     Post Acute Care Choice:    Choice offered to:     DME Arranged:    DME Agency:     HH Arranged:    HH Agency:     Status of Service:  Completed, signed off  Medicare Important Message Given:  Yes Date Medicare IM Given:  12/02/14 Medicare IM give by:  Collie SiadAngela Johnson Date Additional Medicare IM Given:    Additional Medicare Important Message give by:     If discussed at Long Length of Stay Meetings, dates discussed:    Additional Comments:  Marily MemosLisa M Zemirah Krasinski, RN 12/03/2014, 2:48 PM

## 2014-12-03 NOTE — Consult Note (Signed)
  No active bleeding through colostomy though slow drop in hgb. Continue PPI bid. Advance diet as tolerated. Again, no plans for endoscopy unless active and significant bleeding. Will check back on Monday if pt still here. Call GI on call if condition changes. thanks

## 2014-12-03 NOTE — Progress Notes (Signed)
Requested primary nurse attempt to wean O2 in anticipation of discharge today. Will monitor O2 sats for possible home O2.

## 2014-12-03 NOTE — Discharge Instructions (Signed)
If Bleed more- need to go back to PMD or ER immediately.  Have a follow up visit with PMD in 2 weeks to check hemoglobin.

## 2014-12-03 NOTE — Clinical Documentation Improvement (Signed)
Possible Clinical Conditions?    Acute Blood Loss Anemia  Acute on chronic blood loss anemia  Chronic blood loss anemia  Precipitous drop in Hematocrit  Other Condition  Cannot Clinically Determine  Supporting Information: Risk Factors:colostomy. Pt on aspirin. Signs and Symptoms:}Dark-colored stool and clots in &colostomy bag", "Rectal exam showing dark strongly" heme-positive stool.  Diagnostics: Labs: Component     Latest Ref Rng 12/01/2014 12/01/2014 12/02/2014 12/03/2014        12:15 PM  7:49 PM    RBC     3.80 - 5.20 MIL/uL 3.83  3.58 (L) 3.41 (L)  Hemoglobin     12.0 - 16.0 g/dL 52.810.0 (L) 9.9 (L) 9.6 (L) 9.0 (L)    Thank You, Nevin BloodgoodJoan B Shailyn Weyandt, RN, BSN, CCDS,Clinical Documentation Specialist:  [phone]  [phone]=[employer]- Health Information Management

## 2014-12-03 NOTE — Plan of Care (Signed)
Problem: Discharge Progression Outcomes Goal: Discharge plan in place and appropriate Individualization  Pt prefers to be called Briana [name]. She Lives at home by herself, son stays w/ her at night & caregiver during day. Pt has had a colostomy for 70 yrs and usually takes care of it herself.   Pt is a high fall risk and understand how to use call system to use the bathroom and [facility] Pt is very hard of hearing & confusion at times due to new environment.   Granddaughter is employee in ED Goal: Other Discharge Outcomes/Goals Plan of care progress to goals: 1. Pain: no c/o pain during the night  2. Hemodynamically: VSS, no evidence of blood from colostomy, Hgb 9.6 which is trending down though. IVF continued NS@75  3. Complications: none 4. Tolerating diet: tolerating full liquid diet  5: Activity: pt able to move in bed independently but [facility]  No fall or injury this shift. Will continue to assess.

## 2017-08-13 ENCOUNTER — Emergency Department: Payer: Medicare Other

## 2017-08-13 ENCOUNTER — Other Ambulatory Visit: Payer: Self-pay

## 2017-08-13 ENCOUNTER — Observation Stay
Admission: EM | Admit: 2017-08-13 | Discharge: 2017-08-16 | Disposition: A | Discharge status: Skilled Nursing Facility | Payer: Medicare Other | Attending: Internal Medicine | Admitting: Internal Medicine

## 2017-08-13 DIAGNOSIS — N183 Chronic kidney disease, stage 3 (moderate): Secondary | ICD-10-CM | POA: Insufficient documentation

## 2017-08-13 DIAGNOSIS — M10071 Idiopathic gout, right ankle and foot: Secondary | ICD-10-CM

## 2017-08-13 DIAGNOSIS — Z933 Colostomy status: Secondary | ICD-10-CM | POA: Diagnosis not present

## 2017-08-13 DIAGNOSIS — Z79899 Other long term (current) drug therapy: Secondary | ICD-10-CM | POA: Insufficient documentation

## 2017-08-13 DIAGNOSIS — N179 Acute kidney failure, unspecified: Principal | ICD-10-CM

## 2017-08-13 DIAGNOSIS — Z7952 Long term (current) use of systemic steroids: Secondary | ICD-10-CM | POA: Insufficient documentation

## 2017-08-13 DIAGNOSIS — Z66 Do not resuscitate: Secondary | ICD-10-CM | POA: Insufficient documentation

## 2017-08-13 DIAGNOSIS — Z880 Allergy status to penicillin: Secondary | ICD-10-CM | POA: Diagnosis not present

## 2017-08-13 DIAGNOSIS — Z8701 Personal history of pneumonia (recurrent): Secondary | ICD-10-CM | POA: Diagnosis not present

## 2017-08-13 DIAGNOSIS — I129 Hypertensive chronic kidney disease with stage 1 through stage 4 chronic kidney disease, or unspecified chronic kidney disease: Secondary | ICD-10-CM | POA: Diagnosis not present

## 2017-08-13 DIAGNOSIS — N390 Urinary tract infection, site not specified: Secondary | ICD-10-CM | POA: Diagnosis not present

## 2017-08-13 DIAGNOSIS — R4182 Altered mental status, unspecified: Secondary | ICD-10-CM

## 2017-08-13 DIAGNOSIS — Z9981 Dependence on supplemental oxygen: Secondary | ICD-10-CM | POA: Insufficient documentation

## 2017-08-13 DIAGNOSIS — E860 Dehydration: Secondary | ICD-10-CM | POA: Insufficient documentation

## 2017-08-13 DIAGNOSIS — Z7982 Long term (current) use of aspirin: Secondary | ICD-10-CM | POA: Diagnosis not present

## 2017-08-13 DIAGNOSIS — Z515 Encounter for palliative care: Secondary | ICD-10-CM

## 2017-08-13 DIAGNOSIS — M1990 Unspecified osteoarthritis, unspecified site: Secondary | ICD-10-CM | POA: Insufficient documentation

## 2017-08-13 LAB — COMPREHENSIVE METABOLIC PANEL
ALBUMIN: 2.9 g/dL — AB (ref 3.5–5.0)
ALK PHOS: 49 U/L (ref 38–126)
ALT: 7 U/L — AB (ref 14–54)
ANION GAP: 5 (ref 5–15)
AST: 28 U/L (ref 15–41)
BUN: 40 mg/dL — ABNORMAL HIGH (ref 6–20)
CO2: 27 mmol/L (ref 22–32)
CREATININE: 1.91 mg/dL — AB (ref 0.44–1.00)
Calcium: 7.7 mg/dL — ABNORMAL LOW (ref 8.9–10.3)
Chloride: 112 mmol/L — ABNORMAL HIGH (ref 101–111)
GFR calc Af Amer: 23 mL/min — ABNORMAL LOW (ref >60)
GFR calc non Af Amer: 19 mL/min — ABNORMAL LOW (ref >60)
GLUCOSE: 80 mg/dL (ref 65–99)
Potassium: 5.2 mmol/L — ABNORMAL HIGH (ref 3.5–5.1)
SODIUM: 144 mmol/L (ref 135–145)
Total Bilirubin: 0.5 mg/dL (ref 0.3–1.2)
Total Protein: 6.3 g/dL — ABNORMAL LOW (ref 6.5–8.1)

## 2017-08-13 LAB — CBC WITH DIFFERENTIAL/PLATELET
Basophils Absolute: 0 10*3/uL (ref 0–0.1)
Basophils Relative: 0 %
Eosinophils Absolute: 0.2 10*3/uL (ref 0–0.7)
Eosinophils Relative: 5 %
HCT: 32.5 % — ABNORMAL LOW (ref 35.0–47.0)
Hemoglobin: 10 g/dL — ABNORMAL LOW (ref 12.0–16.0)
Lymphocytes Relative: 28 %
Lymphs Abs: 1.2 10*3/uL (ref 1.0–3.6)
MCH: 26.6 pg (ref 26.0–34.0)
MCHC: 30.7 g/dL — ABNORMAL LOW (ref 32.0–36.0)
MCV: 86.7 fL (ref 80.0–100.0)
Monocytes Absolute: 0.5 10*3/uL (ref 0.2–0.9)
Monocytes Relative: 13 %
Neutro Abs: 2.2 10*3/uL (ref 1.4–6.5)
Neutrophils Relative %: 54 %
Platelets: 91 10*3/uL — ABNORMAL LOW (ref 150–440)
RBC: 3.75 MIL/uL — ABNORMAL LOW (ref 3.80–5.20)
RDW: 14.1 % (ref 11.5–14.5)
WBC: 4.1 10*3/uL (ref 3.6–11.0)

## 2017-08-13 LAB — URINALYSIS, COMPLETE (UACMP) WITH MICROSCOPIC
Bilirubin Urine: NEGATIVE
Glucose, UA: NEGATIVE mg/dL
Hgb urine dipstick: NEGATIVE
Ketones, ur: NEGATIVE mg/dL
Nitrite: NEGATIVE
Protein, ur: NEGATIVE mg/dL
Specific Gravity, Urine: 1.012 (ref 1.005–1.030)
pH: 7 (ref 5.0–8.0)

## 2017-08-13 LAB — TROPONIN I: Troponin I: 0.04 ng/mL

## 2017-08-13 LAB — URIC ACID: Uric Acid, Serum: 7.4 mg/dL — ABNORMAL HIGH (ref 2.3–6.6)

## 2017-08-13 MED ORDER — DEXTROSE 5 % IV SOLN
1.0000 g | Freq: Once | INTRAVENOUS | Status: AC
  Administered 2017-08-13: 1 g via INTRAVENOUS
  Filled 2017-08-13: qty 10

## 2017-08-13 MED ORDER — SODIUM CHLORIDE 0.9 % IV SOLN
INTRAVENOUS | Status: DC
  Administered 2017-08-13: 20:00:00 via INTRAVENOUS

## 2017-08-13 MED ORDER — CALCIUM CITRATE-VITAMIN D 500-500 MG-UNIT PO CHEW
1.0000 | CHEWABLE_TABLET | Freq: Two times a day (BID) | ORAL | Status: DC
  Administered 2017-08-14: 1 via ORAL
  Filled 2017-08-13 (×4): qty 1

## 2017-08-13 MED ORDER — PREDNISONE 2.5 MG PO TABS
2.5000 mg | ORAL_TABLET | Freq: Every day | ORAL | Status: DC
Start: 2017-08-13 — End: 2017-08-16
  Administered 2017-08-14 – 2017-08-16 (×3): 2.5 mg via ORAL
  Filled 2017-08-13 (×4): qty 1

## 2017-08-13 MED ORDER — ORAL CARE MOUTH RINSE
15.0000 mL | Freq: Two times a day (BID) | OROMUCOSAL | Status: DC
  Administered 2017-08-14 (×2): 15 mL via OROMUCOSAL

## 2017-08-13 MED ORDER — PANTOPRAZOLE SODIUM 40 MG PO TBEC
40.0000 mg | DELAYED_RELEASE_TABLET | Freq: Every day | ORAL | Status: DC
  Administered 2017-08-14: 40 mg via ORAL
  Filled 2017-08-13 (×2): qty 1

## 2017-08-13 MED ORDER — SODIUM CHLORIDE 0.9 % IV SOLN
Freq: Once | INTRAVENOUS | Status: AC
  Administered 2017-08-13: 15:00:00 via INTRAVENOUS

## 2017-08-13 MED ORDER — HEPARIN SODIUM (PORCINE) 5000 UNIT/ML IJ SOLN
5000.0000 [IU] | Freq: Three times a day (TID) | INTRAMUSCULAR | Status: DC
  Administered 2017-08-14 – 2017-08-16 (×6): 5000 [IU] via SUBCUTANEOUS
  Filled 2017-08-13 (×6): qty 1

## 2017-08-13 MED ORDER — DOCUSATE SODIUM 100 MG PO CAPS: 100.0000 mg | ORAL_CAPSULE | Freq: Two times a day (BID) | ORAL | Status: DC | PRN

## 2017-08-13 MED ORDER — ACETAMINOPHEN 500 MG PO TABS
500.0000 mg | ORAL_TABLET | Freq: Four times a day (QID) | ORAL | Status: DC | PRN
  Administered 2017-08-14: 1000 mg via ORAL
  Filled 2017-08-13: qty 2

## 2017-08-13 NOTE — ED Notes (Signed)
Pt resting on stretcher, family at bedside. Family denies any needs.

## 2017-08-13 NOTE — ED Notes (Signed)
Pt family member stated that pt did not arrive with her teeth. Family member informed that pt could eat applesauce in ED but that was only option at this time. Family member stated that she or another family member would go to pt house and get pt teeth so that she would be able to eat something more.

## 2017-08-13 NOTE — ED Notes (Signed)
Pt brief and linen changed  

## 2017-08-13 NOTE — ED Notes (Signed)
X-ray at bedside

## 2017-08-13 NOTE — ED Provider Notes (Signed)
[facility] Emergency Department Provider Note       Time seen: ----------------------------------------- 11:08 AM on 08/13/2017 ----------------------------------------- Level V caveat: History/ROS limited by altered mental status  I have reviewed the triage vital signs and the nursing notes.  HISTORY   Chief Complaint Altered Mental Status    HPI Briana [name] is a [age] y.o. female with a history of chronic kidney disease, hypertension and pneumonia who presents to the ED for altered mental status.  According to the family she was not waking up normally he appears to be at her baseline.  She was having some redness in her right foot for which she was going to be taken to the podiatrist.  Before this could be done she was lethargic so she was brought to the ER for evaluation.  Patient is very hard of hearing and is brought in lethargic.  Review of systems is limited.  Past Medical History:  Diagnosis Date  . Allergy   . Arthritis   . Chronic kidney disease   . Hypertension   . Osteoporosis   . Pneumonia     Patient Active Problem List   Diagnosis Date Noted  . GIB (gastrointestinal bleeding) 12/01/2014    Past Surgical History:  Procedure Laterality Date  . BLADDER SURGERY    . COLON SURGERY    . COLOSTOMY Left     Allergies Amoxicillin  Social History Social History   Tobacco Use  . Smoking status: Never Smoker  . Smokeless tobacco: Former Engineer, waterUser  Substance Use Topics  . Alcohol use: Not on file  . Drug use: No    Review of Systems Constitutional: Negative for fever. Cardiovascular: Negative for chest pain. Respiratory: Negative for shortness of breath. Gastrointestinal: Negative for abdominal pain, vomiting and diarrhea. Musculoskeletal: Positive for right foot pain Skin: Positive for redness in the right foot Neurological: Negative for headaches, focal weakness or numbness.  All systems negative/normal/unremarkable or unknown  except as stated in the HPI  ____________________________________________   PHYSICAL EXAM:  VITAL SIGNS: ED Triage Vitals [08/13/17 1107]  Enc Vitals Group     BP 135/61     Pulse Rate (!) 50     Resp      Temp 97.7 F (36.5 C)     Temp Source Oral     SpO2 95 %     Weight      Height      Head Circumference      Peak Flow      Pain Score      Pain Loc      Pain Edu?      Excl. in GC?     Constitutional: Alert,  Well appearing and in no distress. Eyes: Conjunctivae are normal. Normal extraocular movements. ENT   Head: Normocephalic and atraumatic.   Nose: No congestion/rhinnorhea.   Mouth/Throat: Mucous membranes are moist.   Neck: No stridor. Cardiovascular: Normal rate, regular rhythm. No murmurs, rubs, or gallops. Respiratory: Normal respiratory effort without tachypnea nor retractions. Breath sounds are clear and equal bilaterally. No wheezes/rales/rhonchi. Gastrointestinal: Soft and nontender. Normal bowel sounds Musculoskeletal: Mild tenderness is noted to the dorsum of the right foot.  Bunions are noted Neurologic:  Normal speech and language. No gross focal neurologic deficits are appreciated.  Skin: Right foot erythema is noted Psychiatric: Mood and affect are normal. Speech and behavior are normal.  ____________________________________________  EKG: Interpreted by me.  Sinus rhythm with a rate of 49 bpm, LVH, incomplete  right bundle branch block, left axis deviation  ____________________________________________  ED COURSE:  As part of my medical decision making, I reviewed the following data within the electronic MEDICAL RECORD NUMBER History obtained from family if available, nursing notes, old chart and ekg, as well as notes from prior ED visits. Patient presented for altered mental status, we will assess with labs and imaging as indicated at this time.   Procedures ____________________________________________   LABS (pertinent  positives/negatives)  Labs Reviewed  CBC WITH DIFFERENTIAL/PLATELET - Abnormal; Notable for the following components:      Result Value   RBC 3.75 (*)    Hemoglobin 10.0 (*)    HCT 32.5 (*)    MCHC 30.7 (*)    Platelets 91 (*)    All other components within normal limits  URINALYSIS, COMPLETE (UACMP) WITH MICROSCOPIC - Abnormal; Notable for the following components:   Color, Urine YELLOW (*)    APPearance HAZY (*)    Leukocytes, UA MODERATE (*)    Bacteria, UA RARE (*)    Squamous Epithelial / LPF 6-30 (*)    All other components within normal limits  COMPREHENSIVE METABOLIC PANEL - Abnormal; Notable for the following components:   Potassium 5.2 (*)    Chloride 112 (*)    BUN 40 (*)    Creatinine, Ser 1.91 (*)    Calcium 7.7 (*)    Total Protein 6.3 (*)    Albumin 2.9 (*)    ALT 7 (*)    GFR calc non Af Amer 19 (*)    GFR calc Af Amer 23 (*)    All other components within normal limits  TROPONIN I - Abnormal; Notable for the following components:   Troponin I 0.04 (*)    All other components within normal limits  URIC ACID - Abnormal; Notable for the following components:   Uric Acid, Serum 7.4 (*)    All other components within normal limits    RADIOLOGY Images were viewed by me  Chest x-ray, CT head  IMPRESSION: Mild diffuse cortical atrophy. Mild chronic ischemic white matter disease. No acute intracranial abnormality seen. IMPRESSION: 1. Poor aeration with bibasilar linear atelectasis. Cannot exclude developing pneumonia at the lung bases. 2. Stable cardiomegaly. 3. Old fracture deformity of the left humeral head and neck. IMPRESSION: 1. Soft tissue swelling and possible erosion involving the neck of the right first metatarsal may indicate arthritis such as gout. Correlate clinically. 2. Hallux valgus.  ____________________________________________  DIFFERENTIAL DIAGNOSIS   Occult infection, UTI, gout, fracture, sepsis, CVA, MI  FINAL ASSESSMENT AND  PLAN  Altered mental status, gout, dehydration, tonically elevated troponin, possible UTI   Plan: Patient had presented for altered mental status and was noted to have a red right foot. Patient's labs revealed some acute on chronic abnormalities including elevated troponin, possible UTI and acute on chronic renal insufficiency. Patient's imaging revealed what appears to be gout in the right foot.  For the above issues we will talk to the hospitalist for admission.   Emily Filbert, MD   Note: This note was generated in part or whole with voice recognition software. Voice recognition is usually quite accurate but there are transcription errors that can and very often do occur. I apologize for any typographical errors that were not detected and corrected.     Emily Filbert, MD 08/13/17 [phone]

## 2017-08-13 NOTE — ED Notes (Signed)
Pt returned from CT via stretcher, family remains at bedside

## 2017-08-13 NOTE — ED Notes (Signed)
Admitting at bedside 

## 2017-08-13 NOTE — H&P (Signed)
[employer] [facility] [place] [facility]   PATIENT NAME: Briana [name]    MR#:  [id number]  DATE OF BIRTH:  01/29/1908  DATE OF ADMISSION:  08/13/2017  PRIMARY CARE PHYSICIAN: [name], MD   REQUESTING/REFERRING PHYSICIAN: Mayford Knife  CHIEF COMPLAINT:   Chief Complaint  Patient presents with  . Altered Mental Status    HISTORY OF PRESENT ILLNESS: Briana [name]  is a 84 y.o. female with a known history of arthritis, chronic kidney disease, hypertension, osteoporosis, pneumonia- lives in her own home, has a caretaker in the daytime and in night her son takes care of her. Her daughter lives in [place] and she comes every other week to live with her and take care of her. At baseline patient does not have any dementia, she is on oxygen 24 hours, has a colostomy , eats and tolerates all kind of food, she needed help to get out of the bed and walk around with a walker and accompanying person has to help her to hold up. Today morning she is not waking up clearly, she is staying more drowsy. Opens her eyes but does not talk much. The sore daughter decided to bring her to the emergency room. Patient is not able to give any further review of system, her daughter denies any other complaints.  PAST MEDICAL HISTORY:   Past Medical History:  Diagnosis Date  . Allergy   . Arthritis   . Chronic kidney disease   . Hypertension   . Osteoporosis   . Pneumonia     PAST SURGICAL HISTORY:  Past Surgical History:  Procedure Laterality Date  . BLADDER SURGERY    . COLON SURGERY    . COLOSTOMY Left     SOCIAL HISTORY:  Social History   Tobacco Use  . Smoking status: Never Smoker  . Smokeless tobacco: Former Engineer, water Use Topics  . Alcohol use: Not on file    FAMILY HISTORY:  Family History  Problem Relation Age of Onset  . Cancer Sister     DRUG ALLERGIES:  Allergies  Allergen Reactions  . Amoxicillin Diarrhea    REVIEW OF SYSTEMS:   Patient is drowsy  and not able to give a review of system.  MEDICATIONS AT HOME:  Prior to Admission medications   Medication Sig Start Date End Date Taking? Authorizing Provider  acetaminophen (TYLENOL) 500 MG tablet Take 500-1,000 mg by mouth every 6 (six) hours as needed for mild pain or headache.   Yes [provider]  Calcium Carbonate-Vitamin D (CALCIUM + D PO) Take 1 tablet by mouth daily.   Yes [provider]  Cholecalciferol (VITAMIN D-3) 1000 UNITS CAPS Take 1 capsule by mouth daily.   Yes [provider]  esomeprazole (NEXIUM 24HR) 20 MG capsule Take 20 mg by mouth daily at 12 noon.   Yes [provider]  ferrous sulfate (FERROUSUL) 325 (65 FE) MG tablet Take 1 tablet (325 mg total) by mouth 2 (two) times daily with a meal. 12/03/14  Yes Altamese Dilling, MD  furosemide (LASIX) 40 MG tablet Take 20 mg by mouth daily.    Yes [provider]  predniSONE (DELTASONE) 5 MG tablet Take 2.5 mg by mouth daily.    Yes [provider]  risperiDONE (RISPERDAL) 0.25 MG tablet Take 0.25 mg by mouth 2 (two) times daily.   Yes [provider]  pantoprazole (PROTONIX) 40 MG tablet Take 1 tablet (40 mg total) by mouth 2 (two) times  daily. Patient not taking: Reported on 08/13/2017 12/03/14   Altamese DillingVachhani, Ivone Licht, MD      PHYSICAL EXAMINATION:   VITAL SIGNS: Blood pressure 130/73, pulse (!) 58, temperature 97.7 F (36.5 C), temperature source Oral, resp. rate 16, SpO2 100 %.  GENERAL:  [age] y.o.-year-old thin patient lying in the bed with no acute distress.  EYES: Pupils equal, round, reactive to light and accommodation. No scleral icterus. Extraocular muscles intact.  HEENT: Head atraumatic, normocephalic. Oropharynx and nasopharynx clear.  NECK:  Supple, no jugular venous distention. No thyroid enlargement, no tenderness.  LUNGS: Normal breath sounds bilaterally, no wheezing, rales,rhonchi or crepitation. No use of accessory muscles of  respiration.  CARDIOVASCULAR: S1, S2 normal. No murmurs, rubs, or gallops.  ABDOMEN: Soft, nontender, nondistended. Bowel sounds present. No organomegaly or mass. Colostomy in place. EXTREMITIES: No pedal edema, cyanosis, or clubbing.  NEUROLOGIC: Cranial nerves II through XII are intact. Muscle strength 3/5 in all extremities. Sensation intact. Gait not checked.  PSYCHIATRIC: The patient is drowsy, opens eyes to stimuli but likes to go back to sleep, follows very simple, and but does not appear to be healing very well.  SKIN: No obvious rash, lesion, or ulcer.   LABORATORY PANEL:   CBC Recent Labs  Lab 08/13/17 1112  WBC 4.1  HGB 10.0*  HCT 32.5*  PLT 91*  MCV 86.7  MCH 26.6  MCHC 30.7*  RDW 14.1  LYMPHSABS 1.2  MONOABS 0.5  EOSABS 0.2  BASOSABS 0.0   ------------------------------------------------------------------------------------------------------------------  Chemistries  Recent Labs  Lab 08/13/17 1338  NA 144  K 5.2*  CL 112*  CO2 27  GLUCOSE 80  BUN 40*  CREATININE 1.91*  CALCIUM 7.7*  AST 28  ALT 7*  ALKPHOS 49  BILITOT 0.5   ------------------------------------------------------------------------------------------------------------------ CrCl cannot be calculated (Unknown ideal weight.). ------------------------------------------------------------------------------------------------------------------ No results for input(s): TSH, T4TOTAL, T3FREE, THYROIDAB in the last 72 hours.  Invalid input(s): FREET3   Coagulation profile No results for input(s): INR, PROTIME in the last 168 hours. ------------------------------------------------------------------------------------------------------------------- No results for input(s): DDIMER in the last 72 hours. -------------------------------------------------------------------------------------------------------------------  Cardiac Enzymes Recent Labs  Lab 08/13/17 1338  TROPONINI 0.04*    ------------------------------------------------------------------------------------------------------------------ Invalid input(s): POCBNP  ---------------------------------------------------------------------------------------------------------------  Urinalysis    Component Value Date/Time   COLORURINE YELLOW (A) 08/13/2017 1338   APPEARANCEUR HAZY (A) 08/13/2017 1338   APPEARANCEUR Hazy 02/07/2014 1116   LABSPEC 1.012 08/13/2017 1338   LABSPEC 1.010 02/07/2014 1116   PHURINE 7.0 08/13/2017 1338   GLUCOSEU NEGATIVE 08/13/2017 1338   GLUCOSEU Negative 02/07/2014 1116   HGBUR NEGATIVE 08/13/2017 1338   BILIRUBINUR NEGATIVE 08/13/2017 1338   BILIRUBINUR Negative 02/07/2014 1116   KETONESUR NEGATIVE 08/13/2017 1338   PROTEINUR NEGATIVE 08/13/2017 1338   NITRITE NEGATIVE 08/13/2017 1338   LEUKOCYTESUR MODERATE (A) 08/13/2017 1338   LEUKOCYTESUR 2+ 02/07/2014 1116     RADIOLOGY: Dg Chest 1 View  Result Date: 08/13/2017 CLINICAL DATA:  Altered mental status, weakness, confusion EXAM: CHEST 1 VIEW COMPARISON:  Chest x-ray of 02/06/2014 and 11/16/2012 FINDINGS: The lungs are not well aerated and there is bibasilar linear atelectasis present. Pneumonia at the lung bases cannot be excluded. No definite pleural effusion is seen. The heart is mildly enlarged and stable. Mediastinal and hilar contours are unremarkable. Prominent soft tissue in the right paratracheal region is stable and consistent with ectatic great vessels. Old fracture deformity of the left humeral head and neck is present and unchanged. IMPRESSION: 1. Poor aeration with bibasilar linear atelectasis. Cannot exclude  developing pneumonia at the lung bases. 2. Stable cardiomegaly. 3. Old fracture deformity of the left humeral head and neck. Electronically Signed   By: Dwyane Dee M.D.   On: 08/13/2017 11:43   Ct Head Wo Contrast  Result Date: 08/13/2017 CLINICAL DATA:  Altered level of consciousness. EXAM: CT HEAD WITHOUT  CONTRAST TECHNIQUE: Contiguous axial images were obtained from the base of the skull through the vertex without intravenous contrast. COMPARISON:  CT scan of February 07, 2014. FINDINGS: Brain: Mild diffuse cortical atrophy is noted. Mild chronic ischemic white matter disease is noted. No mass effect or midline shift is noted. Ventricular size is within normal limits. There is no evidence of mass lesion, hemorrhage or acute infarction. Vascular: No hyperdense vessel or unexpected calcification. Skull: Normal. Negative for fracture or focal lesion. Sinuses/Orbits: No acute finding. Other: None. IMPRESSION: Mild diffuse cortical atrophy. Mild chronic ischemic white matter disease. No acute intracranial abnormality seen. Electronically Signed   By: Lupita Raider, M.D.   On: 08/13/2017 11:53   Dg Foot Complete Right  Result Date: 08/13/2017 CLINICAL DATA:  Pain and swelling of the right foot, weakness, confusion EXAM: RIGHT FOOT COMPLETE - 3+ VIEW COMPARISON:  None. FINDINGS: There is hallux valgus present. There is some degenerative change involving the right first MTP joint. However, there is soft tissue swelling adjacent to the right MTP joint medially with apparent erosion of the neck of the right first metatarsal. These findings may indicate arthritis such as gout and clinical correlation is recommended. Midfoot is unremarkable. Arterial calcifications are noted. IMPRESSION: 1. Soft tissue swelling and possible erosion involving the neck of the right first metatarsal may indicate arthritis such as gout. Correlate clinically. 2. Hallux valgus. Electronically Signed   By: Dwyane Dee M.D.   On: 08/13/2017 11:45    EKG: Orders placed or performed during the hospital encounter of 08/13/17  . EKG 12-Lead  . EKG 12-Lead    IMPRESSION AND PLAN:  * altered mental status   Infection workups are negative so far, CT scan of the head is negative for acute changes.   This may be just due to old age or they may be  underlying small stroke.   I had discussion with her daughter in detail, she understands and agrees further conservative management for now.   We will give her IV fluid and wait for her Awakening.   If she wakes up, and start eating as before, then daughter would like to take her back home as they have a 24 hours care already set up for her.   If she does not wake up much and continued to stay like this where she has decreased oral intake then she may be reaching towards end of her life, and daughter understands that, in that case he would still like to take her home with arrangements for end-of-life at home.   Would not do any further aggressive workup in the hospital for today.   Female get swallow evaluation before giving her any food, if she is not completely awake.   Daughter denies giving any sedating medications.  hold her risperidone for now.  * history of gout   Continue small dose prednisone, will also give acetaminophen as needed.  * Chronic kidney disease stage 3   Hyperkalemia   Will give IV fluids and monitor.  All the records are reviewed and case discussed with ED provider. Management plans discussed with the patient, family and they are in agreement.  CODE STATUS: DNR Code Status History    Date Active Date Inactive Code Status Order ID Comments User Context   12/01/2014 15:08 12/03/2014 18:58 DNR [id number]  Adrian Saran, MD Inpatient    Questions for Most Recent Historical Code Status (Order [id number])    Question Answer Comment   In the event of cardiac or respiratory ARREST Do not call a "code blue"    In the event of cardiac or respiratory ARREST Do not perform Intubation, CPR, defibrillation or ACLS    In the event of cardiac or respiratory ARREST Use medication by any route, position, wound care, and other measures to relive pain and suffering. May use oxygen, suction and manual treatment of airway obstruction as needed for comfort.         Advance Directive  Documentation     Most Recent Value  Type of Advance Directive  Out of facility DNR (pink MOST or yellow form)  Pre-existing out of facility DNR order (yellow form or pink MOST form)  No data  "MOST" Form in Place?  No data     Daughter in the room during my visit.  TOTAL TIME TAKING CARE OF THIS PATIENT: 50 minutes.    Altamese Dilling M.D on 08/13/2017   Between 7am to 6pm - Pager - [phone]  After 6pm go to [email or web address] - password [email or web address]  [facility]  Office  [phone]  CC: Primary care physician; [name], MD   Note: This dictation was prepared with Dragon dictation along with smaller phrase technology. Any transcriptional errors that result from this process are unintentional.

## 2017-08-13 NOTE — ED Notes (Signed)
Family brought pt food.

## 2017-08-13 NOTE — ED Notes (Signed)
External urinary catheter applied to pt and hooked up to suction.

## 2017-08-13 NOTE — Progress Notes (Signed)
Family Meeting Note  Advance Directive:yes  Today a meeting took place with the Patient and daughter.  The following clinical team members were present during this meeting:MD  The following were discussed:Patient's diagnosis:altered mental status, old age , Patient's progosis: Unable to determine and Goals for treatment: DNR  Additional follow-up to be provided: PMD  Time spent during discussion:20 minutes  Altamese DillingVaibhavkumar Esme Freund, MD

## 2017-08-13 NOTE — ED Notes (Signed)
Date and time results received: 08/13/17 1455 Test: troponin Critical Value: 0.04 Name of Provider Notified: Dr. Mayford KnifeWilliams

## 2017-08-14 DIAGNOSIS — Z515 Encounter for palliative care: Secondary | ICD-10-CM

## 2017-08-14 LAB — BASIC METABOLIC PANEL
ANION GAP: 9 (ref 5–15)
BUN: 33 mg/dL — ABNORMAL HIGH (ref 6–20)
CO2: 24 mmol/L (ref 22–32)
Calcium: 8.6 mg/dL — ABNORMAL LOW (ref 8.9–10.3)
Chloride: 108 mmol/L (ref 101–111)
Creatinine, Ser: 1.66 mg/dL — ABNORMAL HIGH (ref 0.44–1.00)
GFR calc Af Amer: 27 mL/min — ABNORMAL LOW (ref >60)
GFR calc non Af Amer: 23 mL/min — ABNORMAL LOW (ref >60)
GLUCOSE: 103 mg/dL — AB (ref 65–99)
POTASSIUM: 4.2 mmol/L (ref 3.5–5.1)
Sodium: 141 mmol/L (ref 135–145)

## 2017-08-14 LAB — CBC
HCT: 37 % (ref 35.0–47.0)
HEMOGLOBIN: 11.6 g/dL — AB (ref 12.0–16.0)
MCH: 27.3 pg (ref 26.0–34.0)
MCHC: 31.3 g/dL — AB (ref 32.0–36.0)
MCV: 87.3 fL (ref 80.0–100.0)
Platelets: 140 10*3/uL — ABNORMAL LOW (ref 150–440)
RBC: 4.23 MIL/uL (ref 3.80–5.20)
RDW: 14 % (ref 11.5–14.5)
WBC: 5.8 10*3/uL (ref 3.6–11.0)

## 2017-08-14 MED ORDER — PANTOPRAZOLE SODIUM 40 MG PO TBEC
40.0000 mg | DELAYED_RELEASE_TABLET | Freq: Every day | ORAL | Status: DC
  Administered 2017-08-15 – 2017-08-16 (×2): 40 mg via ORAL
  Filled 2017-08-14 (×2): qty 1

## 2017-08-14 MED ORDER — ASPIRIN EC 81 MG PO TBEC
81.0000 mg | DELAYED_RELEASE_TABLET | Freq: Every day | ORAL | Status: DC
  Administered 2017-08-14 – 2017-08-16 (×3): 81 mg via ORAL
  Filled 2017-08-14 (×3): qty 1

## 2017-08-14 MED ORDER — DEXTROSE 5 % IV SOLN
1.0000 g | INTRAVENOUS | Status: DC
  Administered 2017-08-14: 1 g via INTRAVENOUS
  Filled 2017-08-14 (×2): qty 10

## 2017-08-14 MED ORDER — CEPHALEXIN 250 MG PO CAPS: 250.0000 mg | ORAL_CAPSULE | Freq: Three times a day (TID) | ORAL | 0 refills | Status: AC

## 2017-08-14 MED ORDER — RISPERIDONE 0.25 MG PO TABS
0.2500 mg | ORAL_TABLET | Freq: Two times a day (BID) | ORAL | Status: DC
  Administered 2017-08-14 – 2017-08-16 (×4): 0.25 mg via ORAL
  Filled 2017-08-14 (×5): qty 1

## 2017-08-14 MED ORDER — COLCHICINE 0.6 MG PO TABS
0.6000 mg | ORAL_TABLET | Freq: Two times a day (BID) | ORAL | Status: DC
  Administered 2017-08-14 – 2017-08-16 (×4): 0.6 mg via ORAL
  Filled 2017-08-14 (×5): qty 1

## 2017-08-14 MED ORDER — FUROSEMIDE 20 MG PO TABS
20.0000 mg | ORAL_TABLET | Freq: Every day | ORAL | Status: DC
  Administered 2017-08-14 – 2017-08-16 (×3): 20 mg via ORAL
  Filled 2017-08-14 (×3): qty 1

## 2017-08-14 NOTE — Progress Notes (Signed)
Advance care planning  Met with patient's healthcare power of attorney her daughter at bedside. Patient lives at home with daughter.  Has outpatient palliative care following.  Has a nurse aide helping out with activities of daily living at home.  She is very hard of hearing. We discussed regarding patient's advanced age and qualifying for hospice services.  With CODE STATUS patient is DNR and DNI.  Daughter understands advancing age and has agreed to have hospice services set up at home.  She already has hospital bed, bedside commode. On 2 L home oxygen at home.  Patient will be discharged home with hospice following.  Time spent 20 minutes

## 2017-08-14 NOTE — Progress Notes (Signed)
Please note patient is currently followed by out patient PALLIATIVE. CMRN Steward DroneBrenda made aware. Dayna BarkerKaren Robertson RN, BSN, [facility] [place], hospital liaison [phone]

## 2017-08-14 NOTE — Care Management Note (Signed)
Case Management Note  Patient Details  Name: Newt Lukeslene E Vaeth MRN: [id number] Date of Birth: 22-Nov-1907  Subjective/Objective:     Ad[facility] under observation status with the diagnosis of altered mental status. Lives with family. POA is Mickeal Skinneroretha Byra [phone]). Sees Dr. Merlinda FrederickMcLaughlin as primary care, Last seen 03/13/2017. [facility] [facility] [address] Home Health many years ago. [facility] in the past. Chronic home oxygen per [facility]. Uses 2 liters continuous. Self feed, Self dress, and self bath. No falls. Good appetite. Family will transport                Action/Plan: Will continue to follow for discharge plans   Expected Discharge Date:                  Expected Discharge Plan:     In-House Referral:   yes  Discharge planning Services   yes  Post Acute Care Choice:    Choice offered to:     DME Arranged:    DME Agency:     HH Arranged:    HH Agency:     Status of Service:     If discussed at [employer] Meetings, dates discussed:    Additional Comments:  Gwenette GreetBrenda S Ivery Michalski, RN MSN CCM Care Management [phone] 08/14/2017, 1:25 PM

## 2017-08-14 NOTE — Progress Notes (Signed)
Pnt became upset and combative tonight Pnt continues to get out of bed and yells at staff when re-directed. Pnt refuses to take her medication. Called pnts granddaughter to ask for any ideas to help calm pnt. Per pnt granddaughter she needs her Risperdal. Explained pnt refuses to take any medications this RN attempts to give. Pnts granddaughter stated she would come to hospital shortly. Pnt bed low and alarm on. This RN sitting at bedside to help calm pnt. Will continue to monitor and assess.

## 2017-08-14 NOTE — Progress Notes (Signed)
No charge note  Reviewed chart.  Talked with Dr. Elpidio AnisSudini.  [facility].  Dr. Elpidio AnisSudini will order hospice directly.  No need for Palliative consultation in the hospital at this time.  Briana RichardsMarianne Yiannis Tulloch, PA-C Palliative Medicine Pager: [phone]

## 2017-08-14 NOTE — Care Management Obs Status (Signed)
MEDICARE OBSERVATION STATUS NOTIFICATION   Patient Details  Name: Briana [name] MRN: [id number] Date of Birth: 04/12/1908   Medicare Observation Status Notification Given:  Yes    Gwenette GreetBrenda S Davinity Fanara, RN 08/14/2017, 1:19 PM

## 2017-08-14 NOTE — Discharge Instructions (Signed)
Resume diet and activity as before ° ° °

## 2017-08-14 NOTE — Progress Notes (Signed)
SLP Cancellation Note  Patient Details Name: Briana [name] MRN: [id number] DOB: 12/08/1907   Cancelled treatment:       Reason Eval/Treat Not Completed: SLP screened, no needs identified, will sign off(chart reviewed; NSG consulted, met w/ pt/family/Dtr). Family/pt denied any difficulty swallowing and is currently on a regular diet; tolerates swallowing pills per NSG. Pt conversed w/ Cognitive-communication deficits noted; unsure of pt's Cognitive function. Pt is HOH. Family denied any new speech-language deficits or swallowing deficits. Family denied need for evaluation at this time stating pt "eats really well". No further skilled ST services indicated as pt appears at her baseline. Family agreed. NSG to reconsult if any change in status.    Orinda Kenner, MS, CCC-SLP Briana [name] 08/14/2017, 1:04 PM

## 2017-08-15 ENCOUNTER — Encounter
Admission: RE | Admit: 2017-08-15 | Discharge: 2017-08-15 | Disposition: A | Discharge status: Home / Self Care | Payer: Medicare Other | Source: Ambulatory Visit | Attending: Internal Medicine | Admitting: Internal Medicine

## 2017-08-15 MED ORDER — CALCIUM CARBONATE-VITAMIN D 500-200 MG-UNIT PO TABS
1.0000 | ORAL_TABLET | Freq: Two times a day (BID) | ORAL | Status: DC
  Administered 2017-08-15 – 2017-08-16 (×2): 1 via ORAL
  Filled 2017-08-15 (×2): qty 1

## 2017-08-15 MED ORDER — CEPHALEXIN 250 MG PO CAPS
250.0000 mg | ORAL_CAPSULE | ORAL | Status: DC
  Administered 2017-08-15: 250 mg via ORAL
  Filled 2017-08-15 (×2): qty 1

## 2017-08-15 NOTE — Clinical Social Work Placement (Signed)
   CLINICAL SOCIAL WORK PLACEMENT  NOTE  Date:  08/15/2017  Patient Details  Name: Briana [name] MRN: [id number] Date of Birth: 1907/12/19  Clinical Social Work is seeking post-discharge placement for this patient at the Skilled  Nursing Facility level of care (*CSW will initial, date and re-position this form in  chart as items are completed):  Yes   Patient/family provided with [facility] Clinical Social Work Department's list of facilities offering this level of care within the geographic area requested by the patient (or if unable, by the patient's family).  Yes   Patient/family informed of their freedom to choose among providers that offer the needed level of care, that participate in Medicare, Medicaid or managed care program needed by the patient, have an available bed and are willing to accept the patient.  Yes   Patient/family informed of [facility]'s ownership interest in [facility] and [facility], as well as of the fact that they are under no obligation to receive care at these facilities.  PASRR submitted to EDS on       PASRR number received on       Existing PASRR number confirmed on 08/15/17     FL2 transmitted to all facilities in geographic area requested by pt/family on 08/15/17     FL2 transmitted to all facilities within larger geographic area on       Patient informed that his/her managed care company has contracts with or will negotiate with certain facilities, including the following:        Yes   Patient/family informed of bed offers received.  [facility]     Physician recommends and patient chooses bed at      Patient to be transferred to   on  .  Patient to be transferred to facility by       Patient family notified on   of transfer.  Name of family member notified:        PHYSICIAN Please sign FL2, Please sign DNR     Additional Comment:    _______________________________________________ Emmanuel Ercole,  Darleen CrockerBailey M, LCSW 08/15/2017, 3:56 PM

## 2017-08-15 NOTE — Progress Notes (Signed)
Pnt's grandaughter was able to return to the hospital and guide pnt to take her medications. Pnt continues to "fidget" and not sleep at this time. Per pnts granddaughter who remains at bedside pnt sleeps intermittently tonight. No other issues or concerns at this time. Bed low and call bell in reach. Bed alarm also on.

## 2017-08-15 NOTE — Progress Notes (Signed)
Clinical Education officer, museum (CSW) met with patient and her daughter Jaclyn Prime and presented bed offers. They chose [facility]. Daughter is okay with a semi-private room, understands that [facility] will have to approve SNF and is willing to sit with patient at [facility] if needed. Per [facility] admissions coordinator at [facility] she will start [facility] SNF authorization today.   [place], [place] [phone]

## 2017-08-15 NOTE — Progress Notes (Signed)
[facility] - [place] [facility]   PATIENT NAME: Briana [name]    MR#:  [id number]  DATE OF BIRTH:  06-06-1908  SUBJECTIVE:  CHIEF COMPLAINT:   Chief Complaint  Patient presents with  . Altered Mental Status   Agitated overnight.  Refusing medications.  Sitting in a chair today.  REVIEW OF SYSTEMS:    Review of Systems  Unable to perform ROS: Mental status change    DRUG ALLERGIES:   Allergies  Allergen Reactions  . Amoxicillin Diarrhea    VITALS:  Blood pressure (!) 152/79, pulse 91, temperature 97.7 F (36.5 C), temperature source Oral, resp. rate 15, height 5\' 2"  (1.575 m), weight 58.3 kg (128 lb 8 oz), SpO2 95 %.  PHYSICAL EXAMINATION:   Physical Exam  GENERAL:  84 y.o.-year-old patient lying in the bed with no acute distress.  Decreased hearing EYES: Pupils equal, round, reactive to light and accommodation. No scleral icterus. Extraocular muscles intact.  HEENT: Head atraumatic, normocephalic. Oropharynx and nasopharynx clear.  NECK:  Supple, no jugular venous distention. No thyroid enlargement, no tenderness.  LUNGS: Normal breath sounds bilaterally, no wheezing, rales, rhonchi. No use of accessory muscles of respiration.  CARDIOVASCULAR: S1, S2 normal. No murmurs, rubs, or gallops.  ABDOMEN: Soft, nontender, nondistended. Bowel sounds present. No organomegaly or mass.  EXTREMITIES: No cyanosis, clubbing or edema b/l.    NEUROLOGIC: Moves all 4 extremities equally PSYCHIATRIC: The patient is pleasantly confused SKIN: No obvious rash, lesion, or ulcer.   LABORATORY PANEL:   CBC Recent Labs  Lab 08/14/17 0447  WBC 5.8  HGB 11.6*  HCT 37.0  PLT 140*   ------------------------------------------------------------------------------------------------------------------ Chemistries  Recent Labs  Lab 08/13/17 1338 08/14/17 0447  NA 144 141  K 5.2* 4.2  CL 112* 108  CO2 27 24  GLUCOSE 80 103*  BUN 40* 33*  CREATININE 1.91* 1.66*   CALCIUM 7.7* 8.6*  AST 28  --   ALT 7*  --   ALKPHOS 49  --   BILITOT 0.5  --    ------------------------------------------------------------------------------------------------------------------  Cardiac Enzymes Recent Labs  Lab 08/13/17 1338  TROPONINI 0.04*   ------------------------------------------------------------------------------------------------------------------  RADIOLOGY:  Dg Chest 1 View  Result Date: 08/13/2017 CLINICAL DATA:  Altered mental status, weakness, confusion EXAM: CHEST 1 VIEW COMPARISON:  Chest x-ray of 02/06/2014 and 11/16/2012 FINDINGS: The lungs are not well aerated and there is bibasilar linear atelectasis present. Pneumonia at the lung bases cannot be excluded. No definite pleural effusion is seen. The heart is mildly enlarged and stable. Mediastinal and hilar contours are unremarkable. Prominent soft tissue in the right paratracheal region is stable and consistent with ectatic great vessels. Old fracture deformity of the left humeral head and neck is present and unchanged. IMPRESSION: 1. Poor aeration with bibasilar linear atelectasis. Cannot exclude developing pneumonia at the lung bases. 2. Stable cardiomegaly. 3. Old fracture deformity of the left humeral head and neck. Electronically Signed   By: Dwyane Dee M.D.   On: 08/13/2017 11:43   Ct Head Wo Contrast  Result Date: 08/13/2017 CLINICAL DATA:  Altered level of consciousness. EXAM: CT HEAD WITHOUT CONTRAST TECHNIQUE: Contiguous axial images were obtained from the base of the skull through the vertex without intravenous contrast. COMPARISON:  CT scan of February 07, 2014. FINDINGS: Brain: Mild diffuse cortical atrophy is noted. Mild chronic ischemic white matter disease is noted. No mass effect or midline shift is noted. Ventricular size is within normal limits. There is no evidence of mass  lesion, hemorrhage or acute infarction. Vascular: No hyperdense vessel or unexpected calcification. Skull: Normal.  Negative for fracture or focal lesion. Sinuses/Orbits: No acute finding. Other: None. IMPRESSION: Mild diffuse cortical atrophy. Mild chronic ischemic white matter disease. No acute intracranial abnormality seen. Electronically Signed   By: Lupita RaiderJames  Green Jr, M.D.   On: 08/13/2017 11:53   Dg Foot Complete Right  Result Date: 08/13/2017 CLINICAL DATA:  Pain and swelling of the right foot, weakness, confusion EXAM: RIGHT FOOT COMPLETE - 3+ VIEW COMPARISON:  None. FINDINGS: There is hallux valgus present. There is some degenerative change involving the right first MTP joint. However, there is soft tissue swelling adjacent to the right MTP joint medially with apparent erosion of the neck of the right first metatarsal. These findings may indicate arthritis such as gout and clinical correlation is recommended. Midfoot is unremarkable. Arterial calcifications are noted. IMPRESSION: 1. Soft tissue swelling and possible erosion involving the neck of the right first metatarsal may indicate arthritis such as gout. Correlate clinically. 2. Hallux valgus. Electronically Signed   By: Dwyane DeePaul  Barry M.D.   On: 08/13/2017 11:45     ASSESSMENT AND PLAN:   *UTI On IV ceftriaxone.  Continue.  *Dehydration.  Resolved with IV fluids.  *Right foot gout.  Started colchicine.  On low-dose prednisone  *Inpatient delirium.  Patient significantly weak than baseline.  At her advanced age I have recommended patient be discharged home with hospice following.  But unfortunately her primary caretaker her son is 82 years old and unable to care for her.  Will request physical therapy consult.  May need skilled nursing facility at discharge.  All the records are reviewed and case discussed with Care Management/Social Worker Management plans discussed with the patient, family and they are in agreement.  CODE STATUS: DNR  DVT Prophylaxis: SCDs  TOTAL TIME TAKING CARE OF THIS PATIENT: 30 minutes.   POSSIBLE D/C IN 1-2 DAYS,  DEPENDING ON CLINICAL CONDITION.  Orie FishermanSrikar R Briel Gallicchio M.D on 08/15/2017 at 11:19 AM  Between 7am to 6pm - Pager - [phone]  After 6pm go to [email or web address] - password [email or web address] [facility]  [facility]  Office  [phone]  CC: Primary care physician; Marguarite ArbourSparks, Jeffrey D, MD  Note: This dictation was prepared with Dragon dictation along with smaller phrase technology. Any transcriptional errors that result from this process are unintentional.

## 2017-08-15 NOTE — Clinical Social Work Note (Addendum)
Clinical Social Work Assessment  Patient Details  Name: Briana [name] MRN: [id number] Date of Birth: 03-10-1908  Date of referral:  08/15/17               Reason for consult:  Facility Placement, Family Concerns, Discharge Planning                Permission sought to share information with:  Case Manager, Magazine features editor, Family Supports Permission granted to share information::  Yes, Verbal Permission Granted  Name::        Agency::     Relationship::  Daughter in room: Investment banker, corporate Information:     Housing/Transportation Living arrangements for the past 2 months:  Single Family Home Source of Information:  Medical Team, Palliative Care Team, Adult Children Patient Interpreter Needed:  None Criminal Activity/Legal Involvement Pertinent to Current Situation/Hospitalization:  No - Comment as needed Significant Relationships:  Merchandiser, retail, Other Family Members, Adult Children, Friend Lives with:  Adult Children Do you feel safe going back to the place where you live?  No Need for family participation in patient care:  Yes (Comment)  Care giving concerns:   Briana [name]  is a 84 y.o. female with a known history of arthritis, chronic kidney disease, hypertension, osteoporosis, pneumonia- lives in her own home, has a caretaker in the daytime and in night her son takes care of her. Her daughter lives in [place] and she comes every other week to live with her and take care of her. At baseline patient does not have any dementia, she is on oxygen 24 hours, has a colostomy , eats and tolerates all kind of food, she needed help to get out of the bed and walk around with a walker and accompanying person has to help her to hold up. Today morning she is not waking up clearly, she is staying more drowsy. Opens her eyes but does not talk much. The daughter decided to bring her to the emergency room. Per daughter, patient at baseline can walk with supervision (2 people),  takes herself and aware of when to go to the bathroom, can feed self, and due to current issues with UTI and foot issue, patient unable to walk. Daughter reports she needs patient to stand, pivot and walk in effort to feel safe and take her home. Daughter's brother is 30 years old and is able to stay with her at night as family has hired a 2 caregivers to help her at home.  Family reports she has been to [facility] in the past and hopeful for return.  Daughter reports they are willing to continue taking care of patient, just need her more steady and able to walk before returning home.  Patient has palliative care in the home, not hospice appropriate at this time.   Social Worker assessment / plan: Assessment completed with daughter. Plan at this time and hopeful for SNF placement back at [facility] where patient has worked and completed rehab in the past after a fall.   LCSW is awaiting PT recommendations and will submit SNF referral.  Will update family as plans evolve.   LCSW educated family with regards to possible denial if insurance feels patient is more custodial/supervision.  Family voices understanding, but hopeful for recommendation for SNF and authorized insurance. Patient does not have medicaid nor other payer other than [employer].  Employment status:  Retired Database administrator PT Recommendations:  Not assessed at this time(PT pending) Information / Referral to  community resources:  Skilled Nursing Facility  Patient/Family's Response to care:  Daughter very involved in care and able to give strong history of patient. She reports she will continue taking care of mother, but just needs her to walk.  Patient/Family's Understanding of and Emotional Response to Diagnosis, Current Treatment, and Prognosis:  Daughter reports patient is very HOH, but overall is aware of current events and able to engage.  Patient is one of 13 siblings and married for 75 years. Her husband passed in  [id number] at 84 years old. Daughter shared patient's life story and history expressing she has lived a strong healthy life.  Emotional Assessment Appearance:  Appears stated age Attitude/Demeanor/Rapport:  Gracious Affect (typically observed):  Accepting, Adaptable Orientation:  Oriented to Self, Oriented to Place Alcohol / Substance use:  Not Applicable Psych involvement (Current and /or in the community):  No (Comment)  Discharge Needs  Concerns to be addressed:  Basic Needs, Adjustment to Illness, Discharge Planning Concerns Readmission within the last 30 days:  No Current discharge risk:  None Barriers to Discharge:  Continued Medical Work up   Raye SorrowCoble, Shatera Rennert N, LCSW 08/15/2017, 11:21 AM

## 2017-08-15 NOTE — Evaluation (Signed)
Physical Therapy Evaluation Patient Details Name: Briana [name] MRN: [id number] DOB: May 29, 1908 Today's Date: 08/15/2017   History of Present Illness  Pt is a [age] y.o. female presenting to hospital with AMS and R foot redness (pt with h/o gout).  PMH includes L colostomy, CKD, htn, PNA; O2 at home  Clinical Impression  Prior to hospital admission, pt was ambulatory with RW household distances with minimal assist of 1 person.  Pt lives in 1 level home with 1 step to enter (B railings) and has 24/7 assist from caregivers and family.  Utilized pt's R post-op shoe (family brought in) which improved pt's comfort and ability to stand on R LE.  Currently pt is 2 assist with transfers (x3 trials) and ambulating 3 feet with RW.  Posterior lean noted with standing (even with multiple attempts at repositioning and cueing) requiring assist to shift pt's weight forward (pt's daughter reports pt did not have posterior lean prior to this).  Overall pt is extremely HOH and required either written cueing or visual cueing/demo; pt did better with functional automatic mobility (familiar activities).  Pt would benefit from skilled PT to address noted impairments and functional limitations (see below for any additional details).  Pt's daughter reports they can not provide current physical assist required to take pt home but are hoping that pt will improve functional mobility (via rehab) so that pt can eventually return home with prior level of assist.    Follow Up Recommendations SNF    Equipment Recommendations  Rolling walker with 5" wheels    Recommendations for Other Services       Precautions / Restrictions Precautions Precautions: Fall Precaution Comments: L colostomy; has post-op shoe to protect R foot in room Restrictions Weight Bearing Restrictions: No      Mobility  Bed Mobility               General bed mobility comments: Deferred d/t pt sitting up in chair beginning and end of  session.  Transfers Overall transfer level: Needs assistance Equipment used: Rolling walker (2 wheeled) Transfers: Sit to/from Stand Sit to Stand: (see comments below for assist levels)         General transfer comment: To stand: trial 1 mod to max assist x2 with RW; trial 2 min to mod assist x2 with RW; trial 3 min assist x2 with RW; tactile cues and visual demonstration required for technique; B feet blocked to prevent sliding  Ambulation/Gait Ambulation/Gait assistance: Min assist;Mod assist;+2 physical assistance Ambulation Distance (Feet): 3 Feet Assistive device: Rolling walker (2 wheeled)   Gait velocity: decreased   General Gait Details: pt initially able to march in place (2 assist for balance d/t posterior lean) using RW and then ambulated 3 feet with RW (limited distance d/t fatigue); decreased B step length/foot clearance/heelstrike noted  Stairs            Wheelchair Mobility    Modified Rankin (Stroke Patients Only)       Balance Overall balance assessment: Needs assistance Sitting-balance support: Bilateral upper extremity supported;Feet supported Sitting balance-Leahy Scale: Poor Sitting balance - Comments: requires B UE support static sitting balance   Standing balance support: Bilateral upper extremity supported Standing balance-Leahy Scale: Poor Standing balance comment: posterior lean standing requiring assist to correct                             Pertinent Vitals/Pain Pain Assessment: Faces Pain Score: 0-No pain(0/10  rest; 6/10 when touching R foot occasionally) Pain Location: grimacing occasionally when touching R foot Pain Descriptors / Indicators: Grimacing Pain Intervention(s): Limited activity within patient's tolerance;Monitored during session;Repositioned  HR 103-123 bpm during session.  O2 92% or greater on room air during session.    Home Living Family/patient expects to be discharged to:: Private residence Living  Arrangements: Alone Available Help at Discharge: Family;Personal care attendant;Available 24 hours/day Type of Home: House Home Access: Stairs to enter Entrance Stairs-Rails: Right;Left;Can reach both Entrance Stairs-Number of Steps: 1 Home Layout: One level Home Equipment: Walker - 2 wheels;Hospital bed;Bedside commode Additional Comments: 2 L home O2 intermittent use per pt's daughter.    Prior Function Level of Independence: Needs assistance   Gait / Transfers Assistance Needed: Pt required SBA to CGA to min assist with bed mobility, transfers, and ambulation (using RW) within the home.    ADL's / Homemaking Assistance Needed: Pt's daughter reports pt would call for help when she needed to go to bathroom and would always wait to get up until assist arrived.  Self feeds.  Comments: Has caregivers during day and pt's son stays with pt at night; pt's daughter comes every other week to help.  Has 24/7 care.     Hand Dominance        Extremity/Trunk Assessment   Upper Extremity Assessment Upper Extremity Assessment: Generalized weakness    Lower Extremity Assessment Lower Extremity Assessment: Generalized weakness    Cervical / Trunk Assessment Cervical / Trunk Assessment: Kyphotic  Communication   Communication: HOH  Cognition Arousal/Alertness: Awake/alert Behavior During Therapy: WFL for tasks assessed/performed Overall Cognitive Status: Difficult to assess(Able to state name)                                        General Comments General comments (skin integrity, edema, etc.): Pt resting in chair with daughter present upon PT arrival.  Nursing cleared pt for participation in physical therapy.  Pt and pt's daughter agreeable to PT session.    Exercises  Transfer/gait training   Assessment/Plan    PT Assessment Patient needs continued PT services  PT Problem List Decreased strength;Decreased activity tolerance;Decreased balance;Decreased  mobility;Pain       PT Treatment Interventions DME instruction;Gait training;Stair training;Functional mobility training;Therapeutic activities;Therapeutic exercise;Balance training;Patient/family education    PT Goals (Current goals can be found in the Care Plan section)  Acute Rehab PT Goals Patient Stated Goal: to be able to transfer and walk again PT Goal Formulation: With patient/family Time For Goal Achievement: 08/29/17 Potential to Achieve Goals: Fair    Frequency Min 2X/week   Barriers to discharge Decreased caregiver support      Co-evaluation               AM-PAC PT "6 Clicks" Daily Activity  Outcome Measure Difficulty turning over in bed (including adjusting bedclothes, sheets and blankets)?: Unable Difficulty moving from lying on back to sitting on the side of the bed? : Unable Difficulty sitting down on and standing up from a chair with arms (e.g., wheelchair, bedside commode, etc,.)?: Unable Help needed moving to and from a bed to chair (including a wheelchair)?: Total Help needed walking in hospital room?: Total Help needed climbing 3-5 steps with a railing? : Total 6 Click Score: 6    End of Session Equipment Utilized During Treatment: Gait belt(up high away from colostomy) Activity Tolerance: Patient tolerated  treatment well Patient left: in chair;with call bell/phone within reach;with chair alarm set;with family/visitor present Nurse Communication: Mobility status;Precautions PT Visit Diagnosis: Other abnormalities of gait and mobility (R26.89);Muscle weakness (generalized) (M62.81);Difficulty in walking, not elsewhere classified (R26.2);Pain Pain - Right/Left: Right Pain - part of body: Ankle and joints of foot    Time: 1610-9604 PT Time Calculation (min) (ACUTE ONLY): 30 min   Charges:   PT Evaluation $PT Eval Low Complexity: 1 Low PT Treatments $Therapeutic Activity: 8-22 mins   PT G CodesHendricks Limes, PT 08/15/17, 2:42  PM [phone]

## 2017-08-15 NOTE — [place] FL2 (Addendum)
[place] MEDICAID FL2 LEVEL OF CARE SCREENING TOOL     IDENTIFICATION  Patient Name: Briana [name] Birthdate: 1908/02/28 Sex: female Admission Date (Current Location): 08/13/2017  [place] and [place] Number:  Chiropodist and Address:  [facility], [address] [place], [place] [address]      Provider Number: [id number]  Attending Physician Name and Address:  Milagros Loll, MD  Relative Name and Phone Number:       Current Level of Care: Hospital Recommended Level of Care: Skilled Nursing Facility Prior Approval Number:    Date Approved/Denied:   PASRR Number:   [id number]  Discharge Plan: SNF    Current Diagnoses: Patient Active Problem List   Diagnosis Date Noted  . Hospice care 08/14/2017  . Altered mental status 08/13/2017  . GIB (gastrointestinal bleeding) 12/01/2014    Orientation RESPIRATION BLADDER Height & Weight     Self, Place  Normal Continent Weight: 128 lb 8 oz (58.3 kg) Height:  5\' 2"  (157.5 cm)  BEHAVIORAL SYMPTOMS/MOOD NEUROLOGICAL BOWEL NUTRITION STATUS      Colostomy Diet(See DC summary)  AMBULATORY STATUS COMMUNICATION OF NEEDS Skin   Limited Assist Verbally Skin abrasions                       Personal Care Assistance Level of Assistance  Bathing, Feeding, Dressing Bathing Assistance: Limited assistance Feeding assistance: Limited assistance Dressing Assistance: Limited assistance     Functional Limitations Info  Sight, Hearing, Speech Sight Info: Impaired(wears glasses) Hearing Info: Impaired Speech Info: Adequate    SPECIAL CARE FACTORS FREQUENCY  PT (By licensed PT), OT (By licensed OT)     PT Frequency: 5x OT Frequency: 5x            Contractures Contractures Info: Not present    Additional Factors Info  Code Status, Allergies Code Status Info: DNR Allergies Info: Amoxicillin           Current Medications (08/15/2017):  This is the current hospital active medication  list Current Facility-Administered Medications  Medication Dose Route Frequency Provider Last Rate Last Dose  . acetaminophen (TYLENOL) tablet 500-1,000 mg  500-1,000 mg Oral Q6H PRN Altamese Dilling, MD   1,000 mg at 08/14/17 1640  . aspirin EC tablet 81 mg  81 mg Oral Daily Milagros Loll, MD   81 mg at 08/15/17 1001  . calcium-vitamin D (OSCAL WITH D) 500-200 MG-UNIT per tablet 1 tablet  1 tablet Oral BID Milagros Loll, MD   1 tablet at 08/15/17 1000  . cefTRIAXone (ROCEPHIN) 1 g in dextrose 5 % 50 mL IVPB  1 g Intravenous Q24H Milagros Loll, MD   Stopped at 08/14/17 1951  . colchicine tablet 0.6 mg  0.6 mg Oral BID Milagros Loll, MD   0.6 mg at 08/15/17 1002  . docusate sodium (COLACE) capsule 100 mg  100 mg Oral BID PRN Altamese Dilling, MD      . furosemide (LASIX) tablet 20 mg  20 mg Oral Daily Nehemiah Mcfarren, Wardell Heath, MD   20 mg at 08/15/17 1000  . heparin injection 5,000 Units  5,000 Units Subcutaneous Q8H Altamese Dilling, MD   5,000 Units at 08/15/17 0600  . MEDLINE mouth rinse  15 mL Mouth Rinse BID Altamese Dilling, MD   15 mL at 08/14/17 2330  . pantoprazole (PROTONIX) EC tablet 40 mg  40 mg Oral Daily Milagros Loll, MD   40 mg at 08/15/17 1001  . predniSONE (DELTASONE) tablet  2.5 mg  2.5 mg Oral Daily Altamese DillingVachhani, Vaibhavkumar, MD   2.5 mg at 08/15/17 1002  . risperiDONE (RISPERDAL) tablet 0.25 mg  0.25 mg Oral BID Milagros LollSudini, Markita Stcharles, MD   0.25 mg at 08/15/17 1002     Discharge Medications: Please see discharge summary for a list of discharge medications.  Relevant Imaging Results:  Relevant Lab Results:   Additional Information SSN: [id number]   Palliative Care following in the community.  Raye Sorrowoble, Hannah N, LCSW

## 2017-08-15 NOTE — Progress Notes (Signed)
Telemetry order noted. Pnt was placed on telemetry but due to confusion and agitation  Leads and tele box were left off. No other issues or concerns at this time. Family remains at bedside.

## 2017-08-15 NOTE — Progress Notes (Signed)
SOUND Physicians - [place] at [facility]   PATIENT NAME: Briana [name]    MR#:  [id number]  DATE OF BIRTH:  02-Sep-1907  SUBJECTIVE:  CHIEF COMPLAINT:   Chief Complaint  Patient presents with  . Altered Mental Status   Patient confused intermittently.  Decreased hearing. Very weak.  REVIEW OF SYSTEMS:    Review of Systems  Unable to perform ROS: Mental status change    DRUG ALLERGIES:   Allergies  Allergen Reactions  . Amoxicillin Diarrhea    VITALS:  Blood pressure (!) 152/79, pulse 91, temperature 97.7 F (36.5 C), temperature source Oral, resp. rate 15, height 5\' 2"  (1.575 m), weight 58.3 kg (128 lb 8 oz), SpO2 95 %.  PHYSICAL EXAMINATION:   Physical Exam  GENERAL:  [age] y.o.-year-old patient lying in the bed with no acute distress.  Decreased hearing EYES: Pupils equal, round, reactive to light and accommodation. No scleral icterus. Extraocular muscles intact.  HEENT: Head atraumatic, normocephalic. Oropharynx and nasopharynx clear.  NECK:  Supple, no jugular venous distention. No thyroid enlargement, no tenderness.  LUNGS: Normal breath sounds bilaterally, no wheezing, rales, rhonchi. No use of accessory muscles of respiration.  CARDIOVASCULAR: S1, S2 normal. No murmurs, rubs, or gallops.  ABDOMEN: Soft, nontender, nondistended. Bowel sounds present. No organomegaly or mass.  EXTREMITIES: No cyanosis, clubbing or edema b/l.    NEUROLOGIC: Moves all 4 extremities equally PSYCHIATRIC: The patient is pleasantly confused SKIN: No obvious rash, lesion, or ulcer.   LABORATORY PANEL:   CBC Recent Labs  Lab 08/14/17 0447  WBC 5.8  HGB 11.6*  HCT 37.0  PLT 140*   ------------------------------------------------------------------------------------------------------------------ Chemistries  Recent Labs  Lab 08/13/17 1338 08/14/17 0447  NA 144 141  K 5.2* 4.2  CL 112* 108  CO2 27 24  GLUCOSE 80 103*  BUN 40* 33*  CREATININE 1.91* 1.66*  CALCIUM  7.7* 8.6*  AST 28  --   ALT 7*  --   ALKPHOS 49  --   BILITOT 0.5  --    ------------------------------------------------------------------------------------------------------------------  Cardiac Enzymes Recent Labs  Lab 08/13/17 1338  TROPONINI 0.04*   ------------------------------------------------------------------------------------------------------------------  RADIOLOGY:  Dg Chest 1 View  Result Date: 08/13/2017 CLINICAL DATA:  Altered mental status, weakness, confusion EXAM: CHEST 1 VIEW COMPARISON:  Chest x-ray of 02/06/2014 and 11/16/2012 FINDINGS: The lungs are not well aerated and there is bibasilar linear atelectasis present. Pneumonia at the lung bases cannot be excluded. No definite pleural effusion is seen. The heart is mildly enlarged and stable. Mediastinal and hilar contours are unremarkable. Prominent soft tissue in the right paratracheal region is stable and consistent with ectatic great vessels. Old fracture deformity of the left humeral head and neck is present and unchanged. IMPRESSION: 1. Poor aeration with bibasilar linear atelectasis. Cannot exclude developing pneumonia at the lung bases. 2. Stable cardiomegaly. 3. Old fracture deformity of the left humeral head and neck. Electronically Signed   By: Dwyane DeePaul  Barry M.D.   On: 08/13/2017 11:43   Ct Head Wo Contrast  Result Date: 08/13/2017 CLINICAL DATA:  Altered level of consciousness. EXAM: CT HEAD WITHOUT CONTRAST TECHNIQUE: Contiguous axial images were obtained from the base of the skull through the vertex without intravenous contrast. COMPARISON:  CT scan of February 07, 2014. FINDINGS: Brain: Mild diffuse cortical atrophy is noted. Mild chronic ischemic white matter disease is noted. No mass effect or midline shift is noted. Ventricular size is within normal limits. There is no evidence of mass lesion, hemorrhage or  acute infarction. Vascular: No hyperdense vessel or unexpected calcification. Skull: Normal. Negative  for fracture or focal lesion. Sinuses/Orbits: No acute finding. Other: None. IMPRESSION: Mild diffuse cortical atrophy. Mild chronic ischemic white matter disease. No acute intracranial abnormality seen. Electronically Signed   By: Lupita Raider, M.D.   On: 08/13/2017 11:53   Dg Foot Complete Right  Result Date: 08/13/2017 CLINICAL DATA:  Pain and swelling of the right foot, weakness, confusion EXAM: RIGHT FOOT COMPLETE - 3+ VIEW COMPARISON:  None. FINDINGS: There is hallux valgus present. There is some degenerative change involving the right first MTP joint. However, there is soft tissue swelling adjacent to the right MTP joint medially with apparent erosion of the neck of the right first metatarsal. These findings may indicate arthritis such as gout and clinical correlation is recommended. Midfoot is unremarkable. Arterial calcifications are noted. IMPRESSION: 1. Soft tissue swelling and possible erosion involving the neck of the right first metatarsal may indicate arthritis such as gout. Correlate clinically. 2. Hallux valgus. Electronically Signed   By: Dwyane Dee M.D.   On: 08/13/2017 11:45     ASSESSMENT AND PLAN:   *UTI Start IV ceftriaxone.  Cultures not sent from emergency room.  Will treat empirically. Spiked fever of 100.2.  *Dehydration.  Resolved with IV fluids.  *Right foot gout.  Start colchicine.  On low-dose prednisone at home which is being continued.  *Inpatient delirium.   All the records are reviewed and case discussed with Care Management/Social Worker Management plans discussed with the patient, family and they are in agreement.  CODE STATUS: DNR  DVT Prophylaxis: SCDs  TOTAL TIME TAKING CARE OF THIS PATIENT: 30 minutes.   POSSIBLE D/C IN 1-2 DAYS, DEPENDING ON CLINICAL CONDITION.  Molinda Bailiff Clorine Swing M.D on 08/15/2017 at 11:16 AM  Between 7am to 6pm - Pager - [phone]  After 6pm go to [email or web address] - password [email or web address] [facility]  [facility] [place] [facility]   Office  [phone]  CC: Primary care physician; [name], MD  Note: This dictation was prepared with Dragon dictation along with smaller phrase technology. Any transcriptional errors that result from this process are unintentional.

## 2017-08-15 NOTE — Progress Notes (Signed)
Pt family encouraged to stay with pt due to her impulsiveness and refuse to stay in bed or the chair without getting help getting up.  Pt has settled down a lot since the family has arrived.  No complaints of pain or discomfort

## 2017-08-15 NOTE — Progress Notes (Signed)
PHARMACY NOTE:  ANTIMICROBIAL RENAL DOSAGE ADJUSTMENT  Current antimicrobial regimen includes a mismatch between antimicrobial dosage and estimated renal function.  As per policy approved by the Pharmacy & Therapeutics and Medical Executive Committees, the antimicrobial dosage will be adjusted accordingly.  Current antimicrobial dosage:  Cephalexin 250 mg PO Q8H   Indication:   Renal Function:  Estimated Creatinine Clearance: 11.8 mL/min (A) (by C-G formula based on SCr of 1.66 mg/dL (H)). []      On intermittent HD, scheduled: []      On CRRT    Antimicrobial dosage has been changed to:  Cephalexin 250 mg PO Q24H   Additional comments:   Thank you for allowing pharmacy to be a part of this patient's care.  Scherrie GerlachRobbins,Sully Dyment D, [facility] Newberg[facility]PH 08/15/2017 3:39 PM

## 2017-08-16 ENCOUNTER — Encounter
Admission: RE | Admit: 2017-08-16 | Discharge: 2017-08-16 | Disposition: A | Discharge status: Home / Self Care | Payer: Medicare Other | Source: Ambulatory Visit | Attending: Internal Medicine | Admitting: Internal Medicine

## 2017-08-16 NOTE — Progress Notes (Signed)
[facility] [phone]. Spoke with  Efraim KaufmannMelissa, patients assigned RN. Provided report on patient. No further questions at current time.

## 2017-08-16 NOTE — Progress Notes (Signed)
Pt. Discharged in care of EMS transport. Family cared belongings to meet at facility. Family questions addressed and no further questions were presented at time of discharge

## 2017-08-16 NOTE — Discharge Summary (Signed)
SOUND Physicians - [place] at [facility]   PATIENT NAME: Briana [name]    MR#:  [id number]  DATE OF BIRTH:  [birth year]  DATE OF ADMISSION:  08/13/2017 ADMITTING PHYSICIAN: Altamese Dilling, MD  DATE OF DISCHARGE: 08/16/2017  PRIMARY CARE PHYSICIAN: [name], MD   ADMISSION DIAGNOSIS:  AKI (acute kidney injury) (HCC) [N17.9] Acute idiopathic gout of right foot [M10.071] Altered mental status, unspecified altered mental status type [R41.82]  DISCHARGE DIAGNOSIS:  Principal Problem:   Altered mental status Active Problems:   Hospice care   SECONDARY DIAGNOSIS:   Past Medical History:  Diagnosis Date  . Allergy   . Arthritis   . Chronic kidney disease   . Hypertension   . Osteoporosis   . Pneumonia      ADMITTING HISTORY  HISTORY OF PRESENT ILLNESS: Briana [name]  is a 84 y.o. female with a known history of arthritis, chronic kidney disease, hypertension, osteoporosis, pneumonia- lives in her own home, has a caretaker in the daytime and in night her son takes care of her. Her daughter lives in [place] and she comes every other week to live with her and take care of her. At baseline patient does not have any dementia, she is on oxygen 24 hours, has a colostomy , eats and tolerates all kind of food, she needed help to get out of the bed and walk around with a walker and accompanying person has to help her to hold up. Today morning she is not waking up clearly, she is staying more drowsy. Opens her eyes but does not talk much. The sore daughter decided to bring her to the emergency room. Patient is not able to give any further review of system, her daughter denies any other complaints.    HOSPITAL COURSE:   *UTI On IV ceftriaxone. Cx not available. Change to keflex  *Dehydration.  Resolved with IV fluids.  *Right foot gout vs bunion from arthritis. empirically Started colchicine.  On low-dose prednisone. Stop colchicine. Tylenol for pain  control  *Inpatient delirium- improved  Stable for d/c to SNF  Will need palliative care following and transition to hospice in the future  CONSULTS OBTAINED:    DRUG ALLERGIES:   Allergies  Allergen Reactions  . Amoxicillin Diarrhea    DISCHARGE MEDICATIONS:   Allergies as of 08/16/2017      Reactions   Amoxicillin Diarrhea      Medication List    TAKE these medications   acetaminophen 500 MG tablet Commonly known as:  TYLENOL Take 500-1,000 mg by mouth every 6 (six) hours as needed for mild pain or headache.   aspirin EC 81 MG tablet Take 81 mg by mouth daily.   CALCIUM + D PO Take 1 tablet by mouth daily.   cephALEXin 250 MG capsule Commonly known as:  KEFLEX Take 1 capsule (250 mg total) by mouth 3 (three) times daily for 4 days.   ferrous sulfate 325 (65 FE) MG tablet Commonly known as:  FERROUSUL Take 1 tablet (325 mg total) by mouth 2 (two) times daily with a meal.   furosemide 40 MG tablet Commonly known as:  LASIX Take 20 mg by mouth daily.   NEXIUM 24HR 20 MG capsule Generic drug:  esomeprazole Take 20 mg by mouth daily at 12 noon.   pantoprazole 40 MG tablet Commonly known as:  PROTONIX Take 1 tablet (40 mg total) by mouth 2 (two) times daily.   predniSONE 5 MG tablet Commonly known as:  DELTASONE Take 2.5 mg by mouth daily.   risperiDONE 0.25 MG tablet Commonly known as:  RISPERDAL Take 0.25 mg by mouth 2 (two) times daily.   Vitamin D-3 1000 units Caps Take 1 capsule by mouth daily.       Today   VITAL SIGNS:  Blood pressure 129/64, pulse 94, temperature 98.2 F (36.8 C), resp. rate (!) 21, height 5\' 2"  (1.575 m), weight 58.3 kg (128 lb 8 oz), SpO2 92 %.  I/O:    Intake/Output Summary (Last 24 hours) at 08/16/2017 0918 Last data filed at 08/15/2017 2253 Gross per 24 hour  Intake 840 ml  Output 251 ml  Net 589 ml    PHYSICAL EXAMINATION:  Physical Exam  GENERAL:  [age] y.o.-year-old patient lying in the bed with no  acute distress.  LUNGS: Normal breath sounds bilaterally, no wheezing, rales,rhonchi or crepitation. No use of accessory muscles of respiration.  CARDIOVASCULAR: S1, S2 normal. No murmurs, rubs, or gallops.  ABDOMEN: Soft, non-tender, non-distended. Bowel sounds present. No organomegaly or mass.  NEUROLOGIC: Moves all 4 extremities. PSYCHIATRIC: The patient is pleasantly confused SKIN: No obvious rash, lesion, or ulcer.  Arthrtis. Bunion foot  DATA REVIEW:   CBC Recent Labs  Lab 08/14/17 0447  WBC 5.8  HGB 11.6*  HCT 37.0  PLT 140*    Chemistries  Recent Labs  Lab 08/13/17 1338 08/14/17 0447  NA 144 141  K 5.2* 4.2  CL 112* 108  CO2 27 24  GLUCOSE 80 103*  BUN 40* 33*  CREATININE 1.91* 1.66*  CALCIUM 7.7* 8.6*  AST 28  --   ALT 7*  --   ALKPHOS 49  --   BILITOT 0.5  --     Cardiac Enzymes Recent Labs  Lab 08/13/17 1338  TROPONINI 0.04*    Microbiology Results  No results found for this or any previous visit.  RADIOLOGY:  No results found.  Follow up with PCP in 1 week.  Management plans discussed with the patient, family and they are in agreement.  CODE STATUS:     Code Status Orders  (From admission, onward)        Start     Ordered   08/13/17 1951  Do not attempt resuscitation (DNR)  Continuous    Question Answer Comment  In the event of cardiac or respiratory ARREST Do not call a "code blue"   In the event of cardiac or respiratory ARREST Do not perform Intubation, CPR, defibrillation or ACLS   In the event of cardiac or respiratory ARREST Use medication by any route, position, wound care, and other measures to relive pain and suffering. May use oxygen, suction and manual treatment of airway obstruction as needed for comfort.      08/13/17 1950    Code Status History    Date Active Date Inactive Code Status Order ID Comments User Context   12/01/2014 15:08 12/03/2014 18:58 DNR [id number]  Adrian SaranMody, Sital, MD Inpatient    Advance Directive  Documentation     Most Recent Value  Type of Advance Directive  Out of facility DNR (pink MOST or yellow form)  Pre-existing out of facility DNR order (yellow form or pink MOST form)  No data  "MOST" Form in Place?  No data      TOTAL TIME TAKING CARE OF THIS PATIENT ON DAY OF DISCHARGE: more than 30 minutes.   Orie FishermanSrikar R Keyandre Pileggi M.D on 08/16/2017 at 9:18 AM  Between 7am to 6pm - Pager -  [phone]  After 6pm go to [email or web address] - password [email or web address] [place] [facility]  Office  [phone]  CC: Primary care physician; Idelle Crouch, MD  Note: This dictation was prepared with Dragon dictation along with smaller phrase technology. Any transcriptional errors that result from this process are unintentional.

## 2017-08-16 NOTE — Progress Notes (Signed)
[facility] is medically stable [facility]. [facility] has been received and patient can come today to room 210-A. RN will call report at [phone] and arrange EMS for transport. Clinical Child psychotherapistocial Worker (CSW) sent [facility] [employer]. Patient is aware of above. CSW contacted patient's daughter Apolinar JunesDorothea and made her aware of above. Daughter was agreed to [facility]. Please reconsult if future social work needs arise. CSW signing off.   [employer] Hughes IncorporatedBailey Eisley Barber, LCSW [phone]

## 2017-08-16 NOTE — Clinical Social Work Placement (Signed)
   CLINICAL SOCIAL WORK PLACEMENT  NOTE  Date:  08/16/2017  Patient Details  Name: Briana [name] MRN: [id number] Date of Birth: February 02, 1908  Clinical Social Work is seeking post-discharge placement for this patient at the Skilled  Nursing Facility level of care (*CSW will initial, date and re-position this form in  chart as items are completed):  Yes   Patient/family provided with [place] Clinical Social Work Department's list of facilities offering this level of care within the geographic area requested by the patient (or if unable, by the patient's family).  Yes   Patient/family informed of their freedom to choose among providers that offer the needed level of care, that participate in Medicare, Medicaid or managed care program needed by the patient, have an available bed and are willing to accept the patient.  Yes   Patient/family informed of [facility]'s ownership interest in [facility] and [facility], as well as of the fact that they are under no obligation to receive care at these facilities.  PASRR submitted to EDS on       PASRR number received on       Existing PASRR number confirmed on 08/15/17     FL2 transmitted to all facilities in geographic area requested by pt/family on 08/15/17     FL2 transmitted to all facilities within larger geographic area on       Patient informed that his/her managed care company has contracts with or will negotiate with certain facilities, including the following:        Yes   Patient/family informed of bed offers received.  [facility] )     Physician recommends and patient chooses bed at      Patient to be transferred to [facility] ) on 08/16/17.  Patient to be transferred to facility by [facility] )     Patient family notified on 08/16/17 of transfer.  Name of family member notified:  (Patient's daughter Apolinar JunesDorothea is aware of D/C today. )     PHYSICIAN       Additional  Comment:    _______________________________________________ Loletha Bertini, Darleen CrockerBailey M, LCSW 08/16/2017, 10:27 AM

## 2017-09-02 ENCOUNTER — Encounter: Payer: Self-pay | Admitting: Gerontology

## 2017-09-02 ENCOUNTER — Non-Acute Institutional Stay (SKILLED_NURSING_FACILITY): Payer: Medicare Other | Admitting: Gerontology

## 2017-09-02 DIAGNOSIS — R531 Weakness: Secondary | ICD-10-CM | POA: Diagnosis not present

## 2017-09-02 DIAGNOSIS — R4182 Altered mental status, unspecified: Secondary | ICD-10-CM | POA: Diagnosis not present

## 2017-09-02 NOTE — Progress Notes (Signed)
[facility]:   [facility] Room Number: 310A Place of Service:  SNF [phone]) Provider:  Lorenso Quarry, NP-C  Sparks, [name], MD  Patient Care Team: [name], MD as PCP - General (Internal Medicine)  Extended Emergency Contact Information Primary Emergency Contact: Frann Rider Home Phone: [phone] Relation: Grandaughter Secondary Emergency Contact: Elenora Fender [place] Home Phone: [phone] Mobile Phone: [phone] Relation: Daughter  Code Status:  DNR Goals of care: Advanced Directive information Advanced Directives 09/02/2017  Does Patient Have a Medical Advance Directive? Yes  Type of Advance Directive Out of facility DNR (pink MOST or yellow form)  Does patient want to make changes to medical advance directive? No - Patient declined  Would patient like information on creating a medical advance directive? -     Chief Complaint  Patient presents with  . Medical Management of Chronic Issues    Routine Visit    HPI:  Pt is a 84 y.o. female seen today for medical management of chronic diseases. Pt was admitted to the facility for rehab following hospitalization at [facility] for AMS r/t UTI. She has been treated appropriately with antibiotics. Pt continues to have some confusion. She had been participating in PT/OT for generalized weakness and deconditioning. Pt denies pain. Pt reports appetite is good, voiding regularly and having regular BMs. Pt is very HOH and communicates with a white board. Palliative care services has been consulted for pt per recommendation from Hospitalist. VSS. No other complaints.       Past Medical History:  Diagnosis Date  . Allergy   . Anemia, unspecified   . Angina pectoris (HCC)   . Arthritis   . Asthma without status asthmaticus    unspecified  . Atherosclerotic heart disease   . Chronic kidney disease   . Diverticulosis   . HH (hiatus hernia)    colonoscopy 03/30/1999  .  Hyperlipidemia, unspecified   . Hypertension   . On prednisone therapy   . Osteoarthritis   . Osteoporosis   . Peripheral neuropathy   . Pneumonia   . Pulmonary fibrosis (HCC)   . Temporal arteritis (HCC) 10/2013   Past Surgical History:  Procedure Laterality Date  . BLADDER SURGERY    . CATARACT EXTRACTION, BILATERAL    . COLON SURGERY    . COLONOSCOPY  03/30/1999  . COLOSTOMY Left 1952   due to diverticular disease  . UPPER GI ENDOSCOPY  03/30/1999    Allergies  Allergen Reactions  . Amoxicillin Diarrhea    Allergies as of 09/02/2017      Reactions   Amoxicillin Diarrhea      Medication List        Accurate as of 09/02/17  1:40 PM. Always use your most recent med list.          acetaminophen 500 MG tablet Commonly known as:  TYLENOL Take 500-1,000 mg by mouth every 6 (six) hours as needed for mild pain or headache.   aspirin EC 81 MG tablet Take 81 mg by mouth daily.   calcium-vitamin D 500-200 MG-UNIT tablet Commonly known as:  OSCAL WITH D Take 1 tablet by mouth daily.   ferrous sulfate 325 (65 FE) MG tablet Commonly known as:  FERROUSUL Take 1 tablet (325 mg total) by mouth 2 (two) times daily with a meal.   furosemide 20 MG tablet Commonly known as:  LASIX Take 20 mg by mouth daily.   NEXIUM 24HR 20 MG capsule Generic drug:  esomeprazole Take 20 mg by mouth daily.   predniSONE 2.5 MG tablet Commonly known as:  DELTASONE Take 2.5 mg by mouth daily with breakfast.   risperiDONE 0.25 MG tablet Commonly known as:  RISPERDAL Take 0.25 mg by mouth 2 (two) times daily.   Vitamin D-3 1000 units Caps Take 1 capsule by mouth daily.       Review of Systems  Unable to perform ROS: Other  Constitutional: Negative for activity change, appetite change, chills, diaphoresis and fever.  HENT: Negative for congestion, mouth sores, nosebleeds, postnasal drip, sneezing, sore throat, trouble swallowing and voice change.   Respiratory: Negative for apnea,  cough, choking, chest tightness, shortness of breath and wheezing.   Cardiovascular: Negative for chest pain, palpitations and leg swelling.  Gastrointestinal: Negative for abdominal distention, abdominal pain, constipation, diarrhea and nausea.  Genitourinary: Negative for difficulty urinating, dysuria, frequency and urgency.  Musculoskeletal: Positive for arthralgias (typical arthritis) and gait problem. Negative for back pain and myalgias.  Skin: Negative for color change, pallor, rash and wound.  Neurological: Positive for weakness. Negative for dizziness, tremors, syncope, speech difficulty, numbness and headaches.  Psychiatric/Behavioral: Positive for confusion. Negative for agitation and behavioral problems.  All other systems reviewed and are negative.   Immunization History  Administered Date(s) Administered  . Influenza-Unspecified 04/14/2010, 04/29/2012, 03/16/2014, 05/09/2016   There are no preventive care reminders to display for this patient. No flowsheet data found. Functional Status Survey:    Vitals:   09/02/17 1156  BP: 100/60  Pulse: 89  Resp: 18  Temp: 97.6 F (36.4 C)  TempSrc: Oral  SpO2: 91%  Weight: 123 lb 14.4 oz (56.2 kg)  Height: 5' (1.524 m)   Body mass index is 24.2 kg/m. Physical Exam  Constitutional: Vital signs are normal. She appears well-developed and well-nourished. She is active and cooperative. She does not appear ill. No distress.  HENT:  Head: Normocephalic and atraumatic.  Right Ear: Decreased hearing is noted.  Left Ear: Decreased hearing is noted.  Mouth/Throat: Uvula is midline, oropharynx is clear and moist and mucous membranes are normal. Mucous membranes are not pale, not dry and not cyanotic.  Eyes: Pupils are equal, round, and reactive to light. Conjunctivae, EOM and lids are normal.  Neck: Trachea normal, normal range of motion and full passive range of motion without pain. Neck supple. No JVD present. No tracheal deviation,  no edema and no erythema present. No thyromegaly present.  Cardiovascular: Normal rate, regular rhythm, intact distal pulses and normal pulses. Exam reveals no gallop, no distant heart sounds and no friction rub.  Murmur heard. Pulses:      Dorsalis pedis pulses are 2+ on the right side, and 2+ on the left side.  No edema  Pulmonary/Chest: Effort normal and breath sounds normal. No accessory muscle usage. No respiratory distress. She has no decreased breath sounds. She has no wheezes. She has no rhonchi. She has no rales. She exhibits no tenderness.  Abdominal: Soft. Normal appearance and bowel sounds are normal. She exhibits no distension and no ascites. There is no tenderness.    Musculoskeletal: Normal range of motion. She exhibits no edema or tenderness.  Expected osteoarthritis, stiffness; Bilateral Calves soft, supple. Negative Homan's Sign. B- pedal pulses equal; generalized weakness- ambulates with rolling walker  Neurological: She is alert. She has normal strength. Coordination and gait abnormal.  Skin: Skin is warm, dry and intact. She is not diaphoretic. No cyanosis. No pallor. Nails show no clubbing.  Psychiatric: She has  a normal mood and affect. Her speech is normal and behavior is normal. Judgment and thought content normal. Cognition and memory are impaired. She exhibits abnormal recent memory.  Nursing note and vitals reviewed.   Labs reviewed: Recent Labs    08/13/17 1338 08/14/17 0447  NA 144 141  K 5.2* 4.2  CL 112* 108  CO2 27 24  GLUCOSE 80 103*  BUN 40* 33*  CREATININE 1.91* 1.66*  CALCIUM 7.7* 8.6*   Recent Labs    08/13/17 1338  AST 28  ALT 7*  ALKPHOS 49  BILITOT 0.5  PROT 6.3*  ALBUMIN 2.9*   Recent Labs    08/13/17 1112 08/14/17 0447  WBC 4.1 5.8  NEUTROABS 2.2  --   HGB 10.0* 11.6*  HCT 32.5* 37.0  MCV 86.7 87.3  PLT 91* 140*   Lab Results  Component Value Date   TSH 0.64 11/28/2013   No results found for: HGBA1C No results found  for: CHOL, HDL, LDLCALC, LDLDIRECT, TRIG, CHOLHDL  Significant Diagnostic Results in last 30 days:  Dg Chest 1 View  Result Date: 08/13/2017 CLINICAL DATA:  Altered mental status, weakness, confusion EXAM: CHEST 1 VIEW COMPARISON:  Chest x-ray of 02/06/2014 and 11/16/2012 FINDINGS: The lungs are not well aerated and there is bibasilar linear atelectasis present. Pneumonia at the lung bases cannot be excluded. No definite pleural effusion is seen. The heart is mildly enlarged and stable. Mediastinal and hilar contours are unremarkable. Prominent soft tissue in the right paratracheal region is stable and consistent with ectatic great vessels. Old fracture deformity of the left humeral head and neck is present and unchanged. IMPRESSION: 1. Poor aeration with bibasilar linear atelectasis. Cannot exclude developing pneumonia at the lung bases. 2. Stable cardiomegaly. 3. Old fracture deformity of the left humeral head and neck. Electronically Signed   By: Dwyane DeePaul  Barry M.D.   On: 08/13/2017 11:43   Ct Head Wo Contrast  Result Date: 08/13/2017 CLINICAL DATA:  Altered level of consciousness. EXAM: CT HEAD WITHOUT CONTRAST TECHNIQUE: Contiguous axial images were obtained from the base of the skull through the vertex without intravenous contrast. COMPARISON:  CT scan of February 07, 2014. FINDINGS: Brain: Mild diffuse cortical atrophy is noted. Mild chronic ischemic white matter disease is noted. No mass effect or midline shift is noted. Ventricular size is within normal limits. There is no evidence of mass lesion, hemorrhage or acute infarction. Vascular: No hyperdense vessel or unexpected calcification. Skull: Normal. Negative for fracture or focal lesion. Sinuses/Orbits: No acute finding. Other: None. IMPRESSION: Mild diffuse cortical atrophy. Mild chronic ischemic white matter disease. No acute intracranial abnormality seen. Electronically Signed   By: Lupita RaiderJames  Green Jr, M.D.   On: 08/13/2017 11:53   Dg Foot Complete  Right  Result Date: 08/13/2017 CLINICAL DATA:  Pain and swelling of the right foot, weakness, confusion EXAM: RIGHT FOOT COMPLETE - 3+ VIEW COMPARISON:  None. FINDINGS: There is hallux valgus present. There is some degenerative change involving the right first MTP joint. However, there is soft tissue swelling adjacent to the right MTP joint medially with apparent erosion of the neck of the right first metatarsal. These findings may indicate arthritis such as gout and clinical correlation is recommended. Midfoot is unremarkable. Arterial calcifications are noted. IMPRESSION: 1. Soft tissue swelling and possible erosion involving the neck of the right first metatarsal may indicate arthritis such as gout. Correlate clinically. 2. Hallux valgus. Electronically Signed   By: Dwyane DeePaul  Barry M.D.   On:  08/13/2017 11:45    Assessment/Plan  Altered mental status, unspecified altered mental status type  Generalized weakness   Continue PT/OT  Continue exercises as taught by PT/OT  Continue to encourage effective communication through use of the white board  Continue Tylenol 500 mg 1-2 tablets po Q 6 hours prn pain  Continue Palliative Care services  Ambulate with assistance  Family/ staff Communication:   Total Time:  Documentation:  Face to Face:  Family/Phone:   Labs/tests ordered:    Medication list reviewed and assessed for continued appropriateness. Monthly medication orders reviewed and signed.  Brynda Rim, NP-C Geriatrics [facility] [id number], [place] [address] Cell Phone (Mon-Fri 8am-5pm):  [phone] On Call:  [phone] & follow prompts after 5pm & weekends Office Phone:  [phone] Office Fax:  [phone]

## 2017-09-05 ENCOUNTER — Encounter: Payer: Self-pay | Admitting: Gerontology

## 2017-09-05 ENCOUNTER — Non-Acute Institutional Stay (SKILLED_NURSING_FACILITY): Payer: Medicare Other | Admitting: Gerontology

## 2017-09-05 DIAGNOSIS — R4182 Altered mental status, unspecified: Secondary | ICD-10-CM

## 2017-09-05 DIAGNOSIS — R531 Weakness: Secondary | ICD-10-CM | POA: Diagnosis not present

## 2017-09-05 NOTE — Progress Notes (Signed)
Location:   [facility] Room Number: 210A Place of Service:  SNF [phone])  Provider: Lorenso Quarry, NP-C  PCP: [name], MD Patient Care Team: [name], MD as PCP - General (Internal Medicine)  Extended Emergency Contact Information Primary Emergency Contact: Frann Rider Home Phone: [phone] Relation: Grandaughter Secondary Emergency Contact: Elenora Fender [place] Home Phone: [phone] Mobile Phone: [phone] Relation: Daughter  Code Status: DNR Goals of care:  Advanced Directive information Advanced Directives 09/05/2017  Does Patient Have a Medical Advance Directive? Yes  Type of Advance Directive Out of facility DNR (pink MOST or yellow form)  Does patient want to make changes to medical advance directive? No - Patient declined  Would patient like information on creating a medical advance directive? -     Allergies  Allergen Reactions  . Amoxicillin Diarrhea    Chief Complaint  Patient presents with  . Discharge Note    Discharged from SNF    HPI:  84 y.o. [name] seen today for discharge evaluation. Pt was admitted to the facility for rehab following hospitalization at [facility] for AMS r/t UTI. Pt has been participating in PT/OT for generalized weakness and deconditioning. Pt denies pain, chest pain or shortness of breath. Ambulatory on unit with rolling walker. UTI resolved- completed coarse of antibiotics. Continues to have some intermittent confusion, but improved. Pt is being followed by Palliative Care services while in the facility. Pt reports her appetite is good, voiding well and having regular BMs. VSS. No other complaints.     Past Medical History:  Diagnosis Date  . Allergy   . Anemia, unspecified   . Angina pectoris (HCC)   . Arthritis   . Asthma without status asthmaticus    unspecified  . Atherosclerotic heart disease   . Chronic kidney disease   . Diverticulosis   . HH (hiatus  hernia)    colonoscopy 03/30/1999  . Hyperlipidemia, unspecified   . Hypertension   . On prednisone therapy   . Osteoarthritis   . Osteoporosis   . Peripheral neuropathy   . Pneumonia   . Pulmonary fibrosis (HCC)   . Temporal arteritis (HCC) 10/2013    Past Surgical History:  Procedure Laterality Date  . BLADDER SURGERY    . CATARACT EXTRACTION, BILATERAL    . COLON SURGERY    . COLONOSCOPY  03/30/1999  . COLOSTOMY Left 1952   due to diverticular disease  . UPPER GI ENDOSCOPY  03/30/1999      reports that  has never smoked. She has quit using smokeless tobacco. Her smokeless tobacco use included snuff. She reports that she does not drink alcohol or use drugs. Social History   Socioeconomic History  . Marital status: Widowed    Spouse name: Not on file  . Number of children: 2  . Years of education: 76  . Highest education level: GED or equivalent  Social Needs  . Financial resource strain: Not on file  . Food insecurity - worry: Not on file  . Food insecurity - inability: Not on file  . Transportation needs - medical: Not on file  . Transportation needs - non-medical: Not on file  Occupational History  . Not on file  Tobacco Use  . Smoking status: Never Smoker  . Smokeless tobacco: Former Neurosurgeon    Types: Snuff  Substance and Sexual Activity  . Alcohol use: No    Frequency: Never  . Drug use: No  . Sexual activity:  No  Other Topics Concern  . Not on file  Social History Narrative  . Not on file   Functional Status Survey:    Allergies  Allergen Reactions  . Amoxicillin Diarrhea    There are no preventive care reminders to display for this patient.  Medications: Allergies as of 09/05/2017      Reactions   Amoxicillin Diarrhea      Medication List        Accurate as of 09/05/17 10:04 AM. Always use your most recent med list.          acetaminophen 500 MG tablet Commonly known as:  TYLENOL Take 500-1,000 mg by mouth every 6 (six) hours as  needed for mild pain or headache.   aspirin EC 81 MG tablet Take 81 mg by mouth daily.   calcium-vitamin D 500-200 MG-UNIT tablet Commonly known as:  OSCAL WITH D Take 1 tablet by mouth daily.   ferrous sulfate 325 (65 FE) MG tablet Commonly known as:  FERROUSUL Take 1 tablet (325 mg total) by mouth 2 (two) times daily with a meal.   furosemide 20 MG tablet Commonly known as:  LASIX Take 20 mg by mouth daily.   NEXIUM 24HR 20 MG capsule Generic drug:  esomeprazole Take 20 mg by mouth daily.   predniSONE 2.5 MG tablet Commonly known as:  DELTASONE Take 2.5 mg by mouth daily with breakfast.   risperiDONE 0.25 MG tablet Commonly known as:  RISPERDAL Take 0.25 mg by mouth 2 (two) times daily.   Vitamin D-3 1000 units Caps Take 1 capsule by mouth daily.       Review of Systems  Vitals:   09/05/17 0957  BP: (!) 100/47  Pulse: 88  Resp: 18  Temp: 98.2 F (36.8 C)  TempSrc: Oral  SpO2: 96%  Weight: 121 lb 12.8 oz (55.2 kg)  Height: 5' (1.524 m)   Body mass index is 23.79 kg/m. Physical Exam  Labs reviewed: Basic Metabolic Panel: Recent Labs    08/13/17 1338 08/14/17 0447  NA 144 141  K 5.2* 4.2  CL 112* 108  CO2 27 24  GLUCOSE 80 103*  BUN 40* 33*  CREATININE 1.91* 1.66*  CALCIUM 7.7* 8.6*   Liver Function Tests: Recent Labs    08/13/17 1338  AST 28  ALT 7*  ALKPHOS 49  BILITOT 0.5  PROT 6.3*  ALBUMIN 2.9*   No results for input(s): LIPASE, AMYLASE in the last 8760 hours. No results for input(s): AMMONIA in the last 8760 hours. CBC: Recent Labs    08/13/17 1112 08/14/17 0447  WBC 4.1 5.8  NEUTROABS 2.2  --   HGB 10.0* 11.6*  HCT 32.5* 37.0  MCV 86.7 87.3  PLT 91* 140*   Cardiac Enzymes: Recent Labs    08/13/17 1338  TROPONINI 0.04*   BNP: Invalid input(s): POCBNP CBG: No results for input(s): GLUCAP in the last 8760 hours.  Procedures and Imaging Studies During Stay: Dg Chest 1 View  Result Date: 08/13/2017 CLINICAL  DATA:  Altered mental status, weakness, confusion EXAM: CHEST 1 VIEW COMPARISON:  Chest x-ray of 02/06/2014 and 11/16/2012 FINDINGS: The lungs are not well aerated and there is bibasilar linear atelectasis present. Pneumonia at the lung bases cannot be excluded. No definite pleural effusion is seen. The heart is mildly enlarged and stable. Mediastinal and hilar contours are unremarkable. Prominent soft tissue in the right paratracheal region is stable and consistent with ectatic great vessels. Old fracture deformity of the  left humeral head and neck is present and unchanged. IMPRESSION: 1. Poor aeration with bibasilar linear atelectasis. Cannot exclude developing pneumonia at the lung bases. 2. Stable cardiomegaly. 3. Old fracture deformity of the left humeral head and neck. Electronically Signed   By: Dwyane DeePaul  Barry M.D.   On: 08/13/2017 11:43   Ct Head Wo Contrast  Result Date: 08/13/2017 CLINICAL DATA:  Altered level of consciousness. EXAM: CT HEAD WITHOUT CONTRAST TECHNIQUE: Contiguous axial images were obtained from the base of the skull through the vertex without intravenous contrast. COMPARISON:  CT scan of February 07, 2014. FINDINGS: Brain: Mild diffuse cortical atrophy is noted. Mild chronic ischemic white matter disease is noted. No mass effect or midline shift is noted. Ventricular size is within normal limits. There is no evidence of mass lesion, hemorrhage or acute infarction. Vascular: No hyperdense vessel or unexpected calcification. Skull: Normal. Negative for fracture or focal lesion. Sinuses/Orbits: No acute finding. Other: None. IMPRESSION: Mild diffuse cortical atrophy. Mild chronic ischemic white matter disease. No acute intracranial abnormality seen. Electronically Signed   By: Lupita RaiderJames  Green Jr, M.D.   On: 08/13/2017 11:53   Dg Foot Complete Right  Result Date: 08/13/2017 CLINICAL DATA:  Pain and swelling of the right foot, weakness, confusion EXAM: RIGHT FOOT COMPLETE - 3+ VIEW COMPARISON:   None. FINDINGS: There is hallux valgus present. There is some degenerative change involving the right first MTP joint. However, there is soft tissue swelling adjacent to the right MTP joint medially with apparent erosion of the neck of the right first metatarsal. These findings may indicate arthritis such as gout and clinical correlation is recommended. Midfoot is unremarkable. Arterial calcifications are noted. IMPRESSION: 1. Soft tissue swelling and possible erosion involving the neck of the right first metatarsal may indicate arthritis such as gout. Correlate clinically. 2. Hallux valgus. Electronically Signed   By: Dwyane DeePaul  Barry M.D.   On: 08/13/2017 11:45    Assessment/Plan:    Altered mental status, unspecified altered mental status type  Generalized weakness   Continue PT/OT  Continue exercises as taught by PT/OT  Continue to ambulate with assistance with rolling walker  Continue to encourage increased PO fluid intake  Safety precautions  Fall precautions  Tylenol 500 mg 1-2 tablets po Q 6 hours prn pain  Follow up with PCO asap after discharge for continuity of care  Patient is being discharged with the following home health services: HHPT/OT/aide/RN through [facility]   Patient is being discharged with the following durable medical equipment:  none  Patient has been advised to f/u with their PCP in 1-2 weeks to bring them up to date on their rehab stay.  Social services at facility was responsible for arranging this appointment.  Pt was provided with a 30 day supply of prescriptions for medications and refills must be obtained from their PCP.  For controlled substances, a more limited supply may be provided adequate until PCP appointment only.  Future labs/tests needed:    Family/ staff Communication:   Total Time:  Documentation:  Face to Face:  Family/Phone:  Brynda RimShannon H. Abiha Lukehart, NP-C Geriatrics [facility] [address]  [place], [place] [id number] Cell Phone (Mon-Fri 8am-5pm):  [phone] On Call:  [phone] & follow prompts after 5pm & weekends Office Phone:  [phone] Office Fax:  [phone]

## 2017-10-14 DIAGNOSIS — M1991 Primary osteoarthritis, unspecified site: Secondary | ICD-10-CM | POA: Diagnosis not present

## 2017-10-14 DIAGNOSIS — I2510 Atherosclerotic heart disease of native coronary artery without angina pectoris: Secondary | ICD-10-CM | POA: Diagnosis not present

## 2017-10-14 DIAGNOSIS — D631 Anemia in chronic kidney disease: Secondary | ICD-10-CM | POA: Diagnosis not present

## 2017-10-14 DIAGNOSIS — I350 Nonrheumatic aortic (valve) stenosis: Secondary | ICD-10-CM | POA: Diagnosis not present

## 2017-10-14 DIAGNOSIS — I38 Endocarditis, valve unspecified: Secondary | ICD-10-CM | POA: Diagnosis not present

## 2017-10-14 DIAGNOSIS — M810 Age-related osteoporosis without current pathological fracture: Secondary | ICD-10-CM | POA: Diagnosis not present

## 2017-10-14 DIAGNOSIS — J8410 Pulmonary fibrosis, unspecified: Secondary | ICD-10-CM | POA: Diagnosis not present

## 2017-10-14 DIAGNOSIS — M10371 Gout due to renal impairment, right ankle and foot: Secondary | ICD-10-CM | POA: Diagnosis not present

## 2017-10-14 DIAGNOSIS — N183 Chronic kidney disease, stage 3 (moderate): Secondary | ICD-10-CM | POA: Diagnosis not present

## 2017-10-14 DIAGNOSIS — K5790 Diverticulosis of intestine, part unspecified, without perforation or abscess without bleeding: Secondary | ICD-10-CM | POA: Diagnosis not present

## 2017-10-14 DIAGNOSIS — I130 Hypertensive heart and chronic kidney disease with heart failure and stage 1 through stage 4 chronic kidney disease, or unspecified chronic kidney disease: Secondary | ICD-10-CM | POA: Diagnosis not present

## 2017-10-14 DIAGNOSIS — I5032 Chronic diastolic (congestive) heart failure: Secondary | ICD-10-CM | POA: Diagnosis not present

## 2017-10-14 DIAGNOSIS — G629 Polyneuropathy, unspecified: Secondary | ICD-10-CM | POA: Diagnosis not present

## 2017-10-16 DIAGNOSIS — Z933 Colostomy status: Secondary | ICD-10-CM | POA: Diagnosis not present

## 2017-10-16 DIAGNOSIS — N183 Chronic kidney disease, stage 3 (moderate): Secondary | ICD-10-CM | POA: Diagnosis not present

## 2017-10-16 DIAGNOSIS — I38 Endocarditis, valve unspecified: Secondary | ICD-10-CM | POA: Diagnosis not present

## 2017-10-16 DIAGNOSIS — I5032 Chronic diastolic (congestive) heart failure: Secondary | ICD-10-CM | POA: Diagnosis not present

## 2017-10-16 DIAGNOSIS — G629 Polyneuropathy, unspecified: Secondary | ICD-10-CM | POA: Diagnosis not present

## 2017-10-16 DIAGNOSIS — I2510 Atherosclerotic heart disease of native coronary artery without angina pectoris: Secondary | ICD-10-CM | POA: Diagnosis not present

## 2017-10-16 DIAGNOSIS — J8410 Pulmonary fibrosis, unspecified: Secondary | ICD-10-CM | POA: Diagnosis not present

## 2017-10-16 DIAGNOSIS — K5790 Diverticulosis of intestine, part unspecified, without perforation or abscess without bleeding: Secondary | ICD-10-CM | POA: Diagnosis not present

## 2017-10-16 DIAGNOSIS — M810 Age-related osteoporosis without current pathological fracture: Secondary | ICD-10-CM | POA: Diagnosis not present

## 2017-10-16 DIAGNOSIS — I350 Nonrheumatic aortic (valve) stenosis: Secondary | ICD-10-CM | POA: Diagnosis not present

## 2017-10-16 DIAGNOSIS — I130 Hypertensive heart and chronic kidney disease with heart failure and stage 1 through stage 4 chronic kidney disease, or unspecified chronic kidney disease: Secondary | ICD-10-CM | POA: Diagnosis not present

## 2017-10-16 DIAGNOSIS — D631 Anemia in chronic kidney disease: Secondary | ICD-10-CM | POA: Diagnosis not present

## 2017-10-16 DIAGNOSIS — M10371 Gout due to renal impairment, right ankle and foot: Secondary | ICD-10-CM | POA: Diagnosis not present

## 2017-10-16 DIAGNOSIS — M1991 Primary osteoarthritis, unspecified site: Secondary | ICD-10-CM | POA: Diagnosis not present

## 2017-10-18 DIAGNOSIS — G629 Polyneuropathy, unspecified: Secondary | ICD-10-CM | POA: Diagnosis not present

## 2017-10-18 DIAGNOSIS — K5790 Diverticulosis of intestine, part unspecified, without perforation or abscess without bleeding: Secondary | ICD-10-CM | POA: Diagnosis not present

## 2017-10-18 DIAGNOSIS — I38 Endocarditis, valve unspecified: Secondary | ICD-10-CM | POA: Diagnosis not present

## 2017-10-18 DIAGNOSIS — M10371 Gout due to renal impairment, right ankle and foot: Secondary | ICD-10-CM | POA: Diagnosis not present

## 2017-10-18 DIAGNOSIS — N183 Chronic kidney disease, stage 3 (moderate): Secondary | ICD-10-CM | POA: Diagnosis not present

## 2017-10-18 DIAGNOSIS — I130 Hypertensive heart and chronic kidney disease with heart failure and stage 1 through stage 4 chronic kidney disease, or unspecified chronic kidney disease: Secondary | ICD-10-CM | POA: Diagnosis not present

## 2017-10-18 DIAGNOSIS — D631 Anemia in chronic kidney disease: Secondary | ICD-10-CM | POA: Diagnosis not present

## 2017-10-18 DIAGNOSIS — I350 Nonrheumatic aortic (valve) stenosis: Secondary | ICD-10-CM | POA: Diagnosis not present

## 2017-10-18 DIAGNOSIS — M1991 Primary osteoarthritis, unspecified site: Secondary | ICD-10-CM | POA: Diagnosis not present

## 2017-10-18 DIAGNOSIS — I2510 Atherosclerotic heart disease of native coronary artery without angina pectoris: Secondary | ICD-10-CM | POA: Diagnosis not present

## 2017-10-18 DIAGNOSIS — M810 Age-related osteoporosis without current pathological fracture: Secondary | ICD-10-CM | POA: Diagnosis not present

## 2017-10-18 DIAGNOSIS — I5032 Chronic diastolic (congestive) heart failure: Secondary | ICD-10-CM | POA: Diagnosis not present

## 2017-10-18 DIAGNOSIS — J8410 Pulmonary fibrosis, unspecified: Secondary | ICD-10-CM | POA: Diagnosis not present

## 2017-10-21 DIAGNOSIS — I2510 Atherosclerotic heart disease of native coronary artery without angina pectoris: Secondary | ICD-10-CM | POA: Diagnosis not present

## 2017-10-21 DIAGNOSIS — G629 Polyneuropathy, unspecified: Secondary | ICD-10-CM | POA: Diagnosis not present

## 2017-10-21 DIAGNOSIS — M10371 Gout due to renal impairment, right ankle and foot: Secondary | ICD-10-CM | POA: Diagnosis not present

## 2017-10-21 DIAGNOSIS — I130 Hypertensive heart and chronic kidney disease with heart failure and stage 1 through stage 4 chronic kidney disease, or unspecified chronic kidney disease: Secondary | ICD-10-CM | POA: Diagnosis not present

## 2017-10-21 DIAGNOSIS — K5790 Diverticulosis of intestine, part unspecified, without perforation or abscess without bleeding: Secondary | ICD-10-CM | POA: Diagnosis not present

## 2017-10-21 DIAGNOSIS — I350 Nonrheumatic aortic (valve) stenosis: Secondary | ICD-10-CM | POA: Diagnosis not present

## 2017-10-21 DIAGNOSIS — M1991 Primary osteoarthritis, unspecified site: Secondary | ICD-10-CM | POA: Diagnosis not present

## 2017-10-21 DIAGNOSIS — J8410 Pulmonary fibrosis, unspecified: Secondary | ICD-10-CM | POA: Diagnosis not present

## 2017-10-21 DIAGNOSIS — M810 Age-related osteoporosis without current pathological fracture: Secondary | ICD-10-CM | POA: Diagnosis not present

## 2017-10-21 DIAGNOSIS — I5032 Chronic diastolic (congestive) heart failure: Secondary | ICD-10-CM | POA: Diagnosis not present

## 2017-10-21 DIAGNOSIS — D631 Anemia in chronic kidney disease: Secondary | ICD-10-CM | POA: Diagnosis not present

## 2017-10-21 DIAGNOSIS — N183 Chronic kidney disease, stage 3 (moderate): Secondary | ICD-10-CM | POA: Diagnosis not present

## 2017-10-21 DIAGNOSIS — I38 Endocarditis, valve unspecified: Secondary | ICD-10-CM | POA: Diagnosis not present

## 2017-10-22 DIAGNOSIS — M810 Age-related osteoporosis without current pathological fracture: Secondary | ICD-10-CM | POA: Diagnosis not present

## 2017-10-22 DIAGNOSIS — K5790 Diverticulosis of intestine, part unspecified, without perforation or abscess without bleeding: Secondary | ICD-10-CM | POA: Diagnosis not present

## 2017-10-22 DIAGNOSIS — I130 Hypertensive heart and chronic kidney disease with heart failure and stage 1 through stage 4 chronic kidney disease, or unspecified chronic kidney disease: Secondary | ICD-10-CM | POA: Diagnosis not present

## 2017-10-22 DIAGNOSIS — I38 Endocarditis, valve unspecified: Secondary | ICD-10-CM | POA: Diagnosis not present

## 2017-10-22 DIAGNOSIS — N183 Chronic kidney disease, stage 3 (moderate): Secondary | ICD-10-CM | POA: Diagnosis not present

## 2017-10-22 DIAGNOSIS — M1991 Primary osteoarthritis, unspecified site: Secondary | ICD-10-CM | POA: Diagnosis not present

## 2017-10-22 DIAGNOSIS — J8410 Pulmonary fibrosis, unspecified: Secondary | ICD-10-CM | POA: Diagnosis not present

## 2017-10-22 DIAGNOSIS — G629 Polyneuropathy, unspecified: Secondary | ICD-10-CM | POA: Diagnosis not present

## 2017-10-22 DIAGNOSIS — J449 Chronic obstructive pulmonary disease, unspecified: Secondary | ICD-10-CM | POA: Diagnosis not present

## 2017-10-22 DIAGNOSIS — I2510 Atherosclerotic heart disease of native coronary artery without angina pectoris: Secondary | ICD-10-CM | POA: Diagnosis not present

## 2017-10-22 DIAGNOSIS — D631 Anemia in chronic kidney disease: Secondary | ICD-10-CM | POA: Diagnosis not present

## 2017-10-22 DIAGNOSIS — I350 Nonrheumatic aortic (valve) stenosis: Secondary | ICD-10-CM | POA: Diagnosis not present

## 2017-10-22 DIAGNOSIS — I5032 Chronic diastolic (congestive) heart failure: Secondary | ICD-10-CM | POA: Diagnosis not present

## 2017-10-22 DIAGNOSIS — M10371 Gout due to renal impairment, right ankle and foot: Secondary | ICD-10-CM | POA: Diagnosis not present

## 2017-10-23 DIAGNOSIS — I130 Hypertensive heart and chronic kidney disease with heart failure and stage 1 through stage 4 chronic kidney disease, or unspecified chronic kidney disease: Secondary | ICD-10-CM | POA: Diagnosis not present

## 2017-10-23 DIAGNOSIS — J8410 Pulmonary fibrosis, unspecified: Secondary | ICD-10-CM | POA: Diagnosis not present

## 2017-10-23 DIAGNOSIS — M1991 Primary osteoarthritis, unspecified site: Secondary | ICD-10-CM | POA: Diagnosis not present

## 2017-10-23 DIAGNOSIS — M353 Polymyalgia rheumatica: Secondary | ICD-10-CM | POA: Diagnosis not present

## 2017-10-23 DIAGNOSIS — G629 Polyneuropathy, unspecified: Secondary | ICD-10-CM | POA: Diagnosis not present

## 2017-10-23 DIAGNOSIS — M10371 Gout due to renal impairment, right ankle and foot: Secondary | ICD-10-CM | POA: Diagnosis not present

## 2017-10-23 DIAGNOSIS — D631 Anemia in chronic kidney disease: Secondary | ICD-10-CM | POA: Diagnosis not present

## 2017-10-23 DIAGNOSIS — M810 Age-related osteoporosis without current pathological fracture: Secondary | ICD-10-CM | POA: Diagnosis not present

## 2017-10-23 DIAGNOSIS — N183 Chronic kidney disease, stage 3 (moderate): Secondary | ICD-10-CM | POA: Diagnosis not present

## 2017-10-23 DIAGNOSIS — I5032 Chronic diastolic (congestive) heart failure: Secondary | ICD-10-CM | POA: Diagnosis not present

## 2017-10-23 DIAGNOSIS — I38 Endocarditis, valve unspecified: Secondary | ICD-10-CM | POA: Diagnosis not present

## 2017-10-23 DIAGNOSIS — I350 Nonrheumatic aortic (valve) stenosis: Secondary | ICD-10-CM | POA: Diagnosis not present

## 2017-10-23 DIAGNOSIS — K5790 Diverticulosis of intestine, part unspecified, without perforation or abscess without bleeding: Secondary | ICD-10-CM | POA: Diagnosis not present

## 2017-10-23 DIAGNOSIS — I2510 Atherosclerotic heart disease of native coronary artery without angina pectoris: Secondary | ICD-10-CM | POA: Diagnosis not present

## 2017-10-24 DIAGNOSIS — K5790 Diverticulosis of intestine, part unspecified, without perforation or abscess without bleeding: Secondary | ICD-10-CM | POA: Diagnosis not present

## 2017-10-24 DIAGNOSIS — I38 Endocarditis, valve unspecified: Secondary | ICD-10-CM | POA: Diagnosis not present

## 2017-10-24 DIAGNOSIS — G629 Polyneuropathy, unspecified: Secondary | ICD-10-CM | POA: Diagnosis not present

## 2017-10-24 DIAGNOSIS — J8410 Pulmonary fibrosis, unspecified: Secondary | ICD-10-CM | POA: Diagnosis not present

## 2017-10-24 DIAGNOSIS — M810 Age-related osteoporosis without current pathological fracture: Secondary | ICD-10-CM | POA: Diagnosis not present

## 2017-10-24 DIAGNOSIS — N183 Chronic kidney disease, stage 3 (moderate): Secondary | ICD-10-CM | POA: Diagnosis not present

## 2017-10-24 DIAGNOSIS — I5032 Chronic diastolic (congestive) heart failure: Secondary | ICD-10-CM | POA: Diagnosis not present

## 2017-10-24 DIAGNOSIS — I130 Hypertensive heart and chronic kidney disease with heart failure and stage 1 through stage 4 chronic kidney disease, or unspecified chronic kidney disease: Secondary | ICD-10-CM | POA: Diagnosis not present

## 2017-10-24 DIAGNOSIS — M1991 Primary osteoarthritis, unspecified site: Secondary | ICD-10-CM | POA: Diagnosis not present

## 2017-10-24 DIAGNOSIS — M10371 Gout due to renal impairment, right ankle and foot: Secondary | ICD-10-CM | POA: Diagnosis not present

## 2017-10-24 DIAGNOSIS — I350 Nonrheumatic aortic (valve) stenosis: Secondary | ICD-10-CM | POA: Diagnosis not present

## 2017-10-24 DIAGNOSIS — I2510 Atherosclerotic heart disease of native coronary artery without angina pectoris: Secondary | ICD-10-CM | POA: Diagnosis not present

## 2017-10-24 DIAGNOSIS — D631 Anemia in chronic kidney disease: Secondary | ICD-10-CM | POA: Diagnosis not present

## 2017-10-29 DIAGNOSIS — I130 Hypertensive heart and chronic kidney disease with heart failure and stage 1 through stage 4 chronic kidney disease, or unspecified chronic kidney disease: Secondary | ICD-10-CM | POA: Diagnosis not present

## 2017-10-29 DIAGNOSIS — I350 Nonrheumatic aortic (valve) stenosis: Secondary | ICD-10-CM | POA: Diagnosis not present

## 2017-10-29 DIAGNOSIS — I2510 Atherosclerotic heart disease of native coronary artery without angina pectoris: Secondary | ICD-10-CM | POA: Diagnosis not present

## 2017-10-29 DIAGNOSIS — M1991 Primary osteoarthritis, unspecified site: Secondary | ICD-10-CM | POA: Diagnosis not present

## 2017-10-29 DIAGNOSIS — G629 Polyneuropathy, unspecified: Secondary | ICD-10-CM | POA: Diagnosis not present

## 2017-10-29 DIAGNOSIS — D631 Anemia in chronic kidney disease: Secondary | ICD-10-CM | POA: Diagnosis not present

## 2017-10-29 DIAGNOSIS — N183 Chronic kidney disease, stage 3 (moderate): Secondary | ICD-10-CM | POA: Diagnosis not present

## 2017-10-29 DIAGNOSIS — J8410 Pulmonary fibrosis, unspecified: Secondary | ICD-10-CM | POA: Diagnosis not present

## 2017-10-29 DIAGNOSIS — I5032 Chronic diastolic (congestive) heart failure: Secondary | ICD-10-CM | POA: Diagnosis not present

## 2017-10-29 DIAGNOSIS — M810 Age-related osteoporosis without current pathological fracture: Secondary | ICD-10-CM | POA: Diagnosis not present

## 2017-10-29 DIAGNOSIS — I38 Endocarditis, valve unspecified: Secondary | ICD-10-CM | POA: Diagnosis not present

## 2017-10-29 DIAGNOSIS — K5790 Diverticulosis of intestine, part unspecified, without perforation or abscess without bleeding: Secondary | ICD-10-CM | POA: Diagnosis not present

## 2017-10-29 DIAGNOSIS — M10371 Gout due to renal impairment, right ankle and foot: Secondary | ICD-10-CM | POA: Diagnosis not present

## 2017-10-30 DIAGNOSIS — I38 Endocarditis, valve unspecified: Secondary | ICD-10-CM | POA: Diagnosis not present

## 2017-10-30 DIAGNOSIS — N183 Chronic kidney disease, stage 3 (moderate): Secondary | ICD-10-CM | POA: Diagnosis not present

## 2017-10-30 DIAGNOSIS — K5790 Diverticulosis of intestine, part unspecified, without perforation or abscess without bleeding: Secondary | ICD-10-CM | POA: Diagnosis not present

## 2017-10-30 DIAGNOSIS — I2510 Atherosclerotic heart disease of native coronary artery without angina pectoris: Secondary | ICD-10-CM | POA: Diagnosis not present

## 2017-10-30 DIAGNOSIS — I5032 Chronic diastolic (congestive) heart failure: Secondary | ICD-10-CM | POA: Diagnosis not present

## 2017-10-30 DIAGNOSIS — D631 Anemia in chronic kidney disease: Secondary | ICD-10-CM | POA: Diagnosis not present

## 2017-10-30 DIAGNOSIS — M810 Age-related osteoporosis without current pathological fracture: Secondary | ICD-10-CM | POA: Diagnosis not present

## 2017-10-30 DIAGNOSIS — I130 Hypertensive heart and chronic kidney disease with heart failure and stage 1 through stage 4 chronic kidney disease, or unspecified chronic kidney disease: Secondary | ICD-10-CM | POA: Diagnosis not present

## 2017-10-30 DIAGNOSIS — J189 Pneumonia, unspecified organism: Secondary | ICD-10-CM | POA: Diagnosis not present

## 2017-10-30 DIAGNOSIS — J8410 Pulmonary fibrosis, unspecified: Secondary | ICD-10-CM | POA: Diagnosis not present

## 2017-10-30 DIAGNOSIS — I350 Nonrheumatic aortic (valve) stenosis: Secondary | ICD-10-CM | POA: Diagnosis not present

## 2017-10-30 DIAGNOSIS — M1991 Primary osteoarthritis, unspecified site: Secondary | ICD-10-CM | POA: Diagnosis not present

## 2017-10-30 DIAGNOSIS — M10371 Gout due to renal impairment, right ankle and foot: Secondary | ICD-10-CM | POA: Diagnosis not present

## 2017-10-30 DIAGNOSIS — G629 Polyneuropathy, unspecified: Secondary | ICD-10-CM | POA: Diagnosis not present

## 2017-11-01 DIAGNOSIS — M10371 Gout due to renal impairment, right ankle and foot: Secondary | ICD-10-CM | POA: Diagnosis not present

## 2017-11-01 DIAGNOSIS — I130 Hypertensive heart and chronic kidney disease with heart failure and stage 1 through stage 4 chronic kidney disease, or unspecified chronic kidney disease: Secondary | ICD-10-CM | POA: Diagnosis not present

## 2017-11-01 DIAGNOSIS — M1991 Primary osteoarthritis, unspecified site: Secondary | ICD-10-CM | POA: Diagnosis not present

## 2017-11-01 DIAGNOSIS — G629 Polyneuropathy, unspecified: Secondary | ICD-10-CM | POA: Diagnosis not present

## 2017-11-01 DIAGNOSIS — I38 Endocarditis, valve unspecified: Secondary | ICD-10-CM | POA: Diagnosis not present

## 2017-11-01 DIAGNOSIS — D631 Anemia in chronic kidney disease: Secondary | ICD-10-CM | POA: Diagnosis not present

## 2017-11-01 DIAGNOSIS — J8410 Pulmonary fibrosis, unspecified: Secondary | ICD-10-CM | POA: Diagnosis not present

## 2017-11-01 DIAGNOSIS — I5032 Chronic diastolic (congestive) heart failure: Secondary | ICD-10-CM | POA: Diagnosis not present

## 2017-11-01 DIAGNOSIS — M810 Age-related osteoporosis without current pathological fracture: Secondary | ICD-10-CM | POA: Diagnosis not present

## 2017-11-01 DIAGNOSIS — N183 Chronic kidney disease, stage 3 (moderate): Secondary | ICD-10-CM | POA: Diagnosis not present

## 2017-11-01 DIAGNOSIS — I2510 Atherosclerotic heart disease of native coronary artery without angina pectoris: Secondary | ICD-10-CM | POA: Diagnosis not present

## 2017-11-01 DIAGNOSIS — I350 Nonrheumatic aortic (valve) stenosis: Secondary | ICD-10-CM | POA: Diagnosis not present

## 2017-11-01 DIAGNOSIS — K5790 Diverticulosis of intestine, part unspecified, without perforation or abscess without bleeding: Secondary | ICD-10-CM | POA: Diagnosis not present

## 2017-11-06 DIAGNOSIS — M810 Age-related osteoporosis without current pathological fracture: Secondary | ICD-10-CM | POA: Diagnosis not present

## 2017-11-06 DIAGNOSIS — K5790 Diverticulosis of intestine, part unspecified, without perforation or abscess without bleeding: Secondary | ICD-10-CM | POA: Diagnosis not present

## 2017-11-06 DIAGNOSIS — G629 Polyneuropathy, unspecified: Secondary | ICD-10-CM | POA: Diagnosis not present

## 2017-11-06 DIAGNOSIS — M1991 Primary osteoarthritis, unspecified site: Secondary | ICD-10-CM | POA: Diagnosis not present

## 2017-11-06 DIAGNOSIS — D631 Anemia in chronic kidney disease: Secondary | ICD-10-CM | POA: Diagnosis not present

## 2017-11-06 DIAGNOSIS — I130 Hypertensive heart and chronic kidney disease with heart failure and stage 1 through stage 4 chronic kidney disease, or unspecified chronic kidney disease: Secondary | ICD-10-CM | POA: Diagnosis not present

## 2017-11-06 DIAGNOSIS — I350 Nonrheumatic aortic (valve) stenosis: Secondary | ICD-10-CM | POA: Diagnosis not present

## 2017-11-06 DIAGNOSIS — M10371 Gout due to renal impairment, right ankle and foot: Secondary | ICD-10-CM | POA: Diagnosis not present

## 2017-11-06 DIAGNOSIS — I5032 Chronic diastolic (congestive) heart failure: Secondary | ICD-10-CM | POA: Diagnosis not present

## 2017-11-06 DIAGNOSIS — I2510 Atherosclerotic heart disease of native coronary artery without angina pectoris: Secondary | ICD-10-CM | POA: Diagnosis not present

## 2017-11-06 DIAGNOSIS — N183 Chronic kidney disease, stage 3 (moderate): Secondary | ICD-10-CM | POA: Diagnosis not present

## 2017-11-06 DIAGNOSIS — I38 Endocarditis, valve unspecified: Secondary | ICD-10-CM | POA: Diagnosis not present

## 2017-11-06 DIAGNOSIS — J8410 Pulmonary fibrosis, unspecified: Secondary | ICD-10-CM | POA: Diagnosis not present

## 2017-11-07 DIAGNOSIS — D631 Anemia in chronic kidney disease: Secondary | ICD-10-CM | POA: Diagnosis not present

## 2017-11-07 DIAGNOSIS — J8410 Pulmonary fibrosis, unspecified: Secondary | ICD-10-CM | POA: Diagnosis not present

## 2017-11-07 DIAGNOSIS — M1991 Primary osteoarthritis, unspecified site: Secondary | ICD-10-CM | POA: Diagnosis not present

## 2017-11-07 DIAGNOSIS — I350 Nonrheumatic aortic (valve) stenosis: Secondary | ICD-10-CM | POA: Diagnosis not present

## 2017-11-07 DIAGNOSIS — I130 Hypertensive heart and chronic kidney disease with heart failure and stage 1 through stage 4 chronic kidney disease, or unspecified chronic kidney disease: Secondary | ICD-10-CM | POA: Diagnosis not present

## 2017-11-07 DIAGNOSIS — I2510 Atherosclerotic heart disease of native coronary artery without angina pectoris: Secondary | ICD-10-CM | POA: Diagnosis not present

## 2017-11-07 DIAGNOSIS — G629 Polyneuropathy, unspecified: Secondary | ICD-10-CM | POA: Diagnosis not present

## 2017-11-07 DIAGNOSIS — M10371 Gout due to renal impairment, right ankle and foot: Secondary | ICD-10-CM | POA: Diagnosis not present

## 2017-11-07 DIAGNOSIS — I5032 Chronic diastolic (congestive) heart failure: Secondary | ICD-10-CM | POA: Diagnosis not present

## 2017-11-07 DIAGNOSIS — K5790 Diverticulosis of intestine, part unspecified, without perforation or abscess without bleeding: Secondary | ICD-10-CM | POA: Diagnosis not present

## 2017-11-07 DIAGNOSIS — I38 Endocarditis, valve unspecified: Secondary | ICD-10-CM | POA: Diagnosis not present

## 2017-11-07 DIAGNOSIS — N183 Chronic kidney disease, stage 3 (moderate): Secondary | ICD-10-CM | POA: Diagnosis not present

## 2017-11-07 DIAGNOSIS — M810 Age-related osteoporosis without current pathological fracture: Secondary | ICD-10-CM | POA: Diagnosis not present

## 2017-11-08 DIAGNOSIS — M10371 Gout due to renal impairment, right ankle and foot: Secondary | ICD-10-CM | POA: Diagnosis not present

## 2017-11-08 DIAGNOSIS — I350 Nonrheumatic aortic (valve) stenosis: Secondary | ICD-10-CM | POA: Diagnosis not present

## 2017-11-08 DIAGNOSIS — M1991 Primary osteoarthritis, unspecified site: Secondary | ICD-10-CM | POA: Diagnosis not present

## 2017-11-08 DIAGNOSIS — I5032 Chronic diastolic (congestive) heart failure: Secondary | ICD-10-CM | POA: Diagnosis not present

## 2017-11-08 DIAGNOSIS — K5790 Diverticulosis of intestine, part unspecified, without perforation or abscess without bleeding: Secondary | ICD-10-CM | POA: Diagnosis not present

## 2017-11-08 DIAGNOSIS — M810 Age-related osteoporosis without current pathological fracture: Secondary | ICD-10-CM | POA: Diagnosis not present

## 2017-11-08 DIAGNOSIS — I130 Hypertensive heart and chronic kidney disease with heart failure and stage 1 through stage 4 chronic kidney disease, or unspecified chronic kidney disease: Secondary | ICD-10-CM | POA: Diagnosis not present

## 2017-11-08 DIAGNOSIS — N183 Chronic kidney disease, stage 3 (moderate): Secondary | ICD-10-CM | POA: Diagnosis not present

## 2017-11-08 DIAGNOSIS — I2510 Atherosclerotic heart disease of native coronary artery without angina pectoris: Secondary | ICD-10-CM | POA: Diagnosis not present

## 2017-11-08 DIAGNOSIS — D631 Anemia in chronic kidney disease: Secondary | ICD-10-CM | POA: Diagnosis not present

## 2017-11-08 DIAGNOSIS — I38 Endocarditis, valve unspecified: Secondary | ICD-10-CM | POA: Diagnosis not present

## 2017-11-08 DIAGNOSIS — G629 Polyneuropathy, unspecified: Secondary | ICD-10-CM | POA: Diagnosis not present

## 2017-11-08 DIAGNOSIS — J8410 Pulmonary fibrosis, unspecified: Secondary | ICD-10-CM | POA: Diagnosis not present

## 2017-11-11 DIAGNOSIS — I350 Nonrheumatic aortic (valve) stenosis: Secondary | ICD-10-CM | POA: Diagnosis not present

## 2017-11-11 DIAGNOSIS — J8410 Pulmonary fibrosis, unspecified: Secondary | ICD-10-CM | POA: Diagnosis not present

## 2017-11-11 DIAGNOSIS — I38 Endocarditis, valve unspecified: Secondary | ICD-10-CM | POA: Diagnosis not present

## 2017-11-11 DIAGNOSIS — M810 Age-related osteoporosis without current pathological fracture: Secondary | ICD-10-CM | POA: Diagnosis not present

## 2017-11-11 DIAGNOSIS — M10371 Gout due to renal impairment, right ankle and foot: Secondary | ICD-10-CM | POA: Diagnosis not present

## 2017-11-11 DIAGNOSIS — K5790 Diverticulosis of intestine, part unspecified, without perforation or abscess without bleeding: Secondary | ICD-10-CM | POA: Diagnosis not present

## 2017-11-11 DIAGNOSIS — I2510 Atherosclerotic heart disease of native coronary artery without angina pectoris: Secondary | ICD-10-CM | POA: Diagnosis not present

## 2017-11-11 DIAGNOSIS — M1991 Primary osteoarthritis, unspecified site: Secondary | ICD-10-CM | POA: Diagnosis not present

## 2017-11-11 DIAGNOSIS — I130 Hypertensive heart and chronic kidney disease with heart failure and stage 1 through stage 4 chronic kidney disease, or unspecified chronic kidney disease: Secondary | ICD-10-CM | POA: Diagnosis not present

## 2017-11-11 DIAGNOSIS — G629 Polyneuropathy, unspecified: Secondary | ICD-10-CM | POA: Diagnosis not present

## 2017-11-11 DIAGNOSIS — I5032 Chronic diastolic (congestive) heart failure: Secondary | ICD-10-CM | POA: Diagnosis not present

## 2017-11-11 DIAGNOSIS — N183 Chronic kidney disease, stage 3 (moderate): Secondary | ICD-10-CM | POA: Diagnosis not present

## 2017-11-11 DIAGNOSIS — D631 Anemia in chronic kidney disease: Secondary | ICD-10-CM | POA: Diagnosis not present

## 2017-11-12 DIAGNOSIS — I5032 Chronic diastolic (congestive) heart failure: Secondary | ICD-10-CM | POA: Diagnosis not present

## 2017-11-12 DIAGNOSIS — D631 Anemia in chronic kidney disease: Secondary | ICD-10-CM | POA: Diagnosis not present

## 2017-11-12 DIAGNOSIS — N183 Chronic kidney disease, stage 3 (moderate): Secondary | ICD-10-CM | POA: Diagnosis not present

## 2017-11-12 DIAGNOSIS — I350 Nonrheumatic aortic (valve) stenosis: Secondary | ICD-10-CM | POA: Diagnosis not present

## 2017-11-12 DIAGNOSIS — I2510 Atherosclerotic heart disease of native coronary artery without angina pectoris: Secondary | ICD-10-CM | POA: Diagnosis not present

## 2017-11-12 DIAGNOSIS — K5790 Diverticulosis of intestine, part unspecified, without perforation or abscess without bleeding: Secondary | ICD-10-CM | POA: Diagnosis not present

## 2017-11-12 DIAGNOSIS — M1991 Primary osteoarthritis, unspecified site: Secondary | ICD-10-CM | POA: Diagnosis not present

## 2017-11-12 DIAGNOSIS — I38 Endocarditis, valve unspecified: Secondary | ICD-10-CM | POA: Diagnosis not present

## 2017-11-12 DIAGNOSIS — M810 Age-related osteoporosis without current pathological fracture: Secondary | ICD-10-CM | POA: Diagnosis not present

## 2017-11-12 DIAGNOSIS — G629 Polyneuropathy, unspecified: Secondary | ICD-10-CM | POA: Diagnosis not present

## 2017-11-12 DIAGNOSIS — M10371 Gout due to renal impairment, right ankle and foot: Secondary | ICD-10-CM | POA: Diagnosis not present

## 2017-11-12 DIAGNOSIS — I130 Hypertensive heart and chronic kidney disease with heart failure and stage 1 through stage 4 chronic kidney disease, or unspecified chronic kidney disease: Secondary | ICD-10-CM | POA: Diagnosis not present

## 2017-11-12 DIAGNOSIS — J8410 Pulmonary fibrosis, unspecified: Secondary | ICD-10-CM | POA: Diagnosis not present

## 2017-11-13 DIAGNOSIS — I38 Endocarditis, valve unspecified: Secondary | ICD-10-CM | POA: Diagnosis not present

## 2017-11-13 DIAGNOSIS — G629 Polyneuropathy, unspecified: Secondary | ICD-10-CM | POA: Diagnosis not present

## 2017-11-13 DIAGNOSIS — M10371 Gout due to renal impairment, right ankle and foot: Secondary | ICD-10-CM | POA: Diagnosis not present

## 2017-11-13 DIAGNOSIS — I2510 Atherosclerotic heart disease of native coronary artery without angina pectoris: Secondary | ICD-10-CM | POA: Diagnosis not present

## 2017-11-13 DIAGNOSIS — J8410 Pulmonary fibrosis, unspecified: Secondary | ICD-10-CM | POA: Diagnosis not present

## 2017-11-13 DIAGNOSIS — I350 Nonrheumatic aortic (valve) stenosis: Secondary | ICD-10-CM | POA: Diagnosis not present

## 2017-11-13 DIAGNOSIS — D631 Anemia in chronic kidney disease: Secondary | ICD-10-CM | POA: Diagnosis not present

## 2017-11-13 DIAGNOSIS — K5790 Diverticulosis of intestine, part unspecified, without perforation or abscess without bleeding: Secondary | ICD-10-CM | POA: Diagnosis not present

## 2017-11-13 DIAGNOSIS — N183 Chronic kidney disease, stage 3 (moderate): Secondary | ICD-10-CM | POA: Diagnosis not present

## 2017-11-13 DIAGNOSIS — M1991 Primary osteoarthritis, unspecified site: Secondary | ICD-10-CM | POA: Diagnosis not present

## 2017-11-13 DIAGNOSIS — I5032 Chronic diastolic (congestive) heart failure: Secondary | ICD-10-CM | POA: Diagnosis not present

## 2017-11-13 DIAGNOSIS — I130 Hypertensive heart and chronic kidney disease with heart failure and stage 1 through stage 4 chronic kidney disease, or unspecified chronic kidney disease: Secondary | ICD-10-CM | POA: Diagnosis not present

## 2017-11-13 DIAGNOSIS — M810 Age-related osteoporosis without current pathological fracture: Secondary | ICD-10-CM | POA: Diagnosis not present

## 2017-11-14 DIAGNOSIS — I5032 Chronic diastolic (congestive) heart failure: Secondary | ICD-10-CM | POA: Diagnosis not present

## 2017-11-14 DIAGNOSIS — I350 Nonrheumatic aortic (valve) stenosis: Secondary | ICD-10-CM | POA: Diagnosis not present

## 2017-11-14 DIAGNOSIS — M810 Age-related osteoporosis without current pathological fracture: Secondary | ICD-10-CM | POA: Diagnosis not present

## 2017-11-14 DIAGNOSIS — M1991 Primary osteoarthritis, unspecified site: Secondary | ICD-10-CM | POA: Diagnosis not present

## 2017-11-14 DIAGNOSIS — I38 Endocarditis, valve unspecified: Secondary | ICD-10-CM | POA: Diagnosis not present

## 2017-11-14 DIAGNOSIS — J8410 Pulmonary fibrosis, unspecified: Secondary | ICD-10-CM | POA: Diagnosis not present

## 2017-11-14 DIAGNOSIS — K5790 Diverticulosis of intestine, part unspecified, without perforation or abscess without bleeding: Secondary | ICD-10-CM | POA: Diagnosis not present

## 2017-11-14 DIAGNOSIS — M10371 Gout due to renal impairment, right ankle and foot: Secondary | ICD-10-CM | POA: Diagnosis not present

## 2017-11-14 DIAGNOSIS — I130 Hypertensive heart and chronic kidney disease with heart failure and stage 1 through stage 4 chronic kidney disease, or unspecified chronic kidney disease: Secondary | ICD-10-CM | POA: Diagnosis not present

## 2017-11-14 DIAGNOSIS — D631 Anemia in chronic kidney disease: Secondary | ICD-10-CM | POA: Diagnosis not present

## 2017-11-14 DIAGNOSIS — I2510 Atherosclerotic heart disease of native coronary artery without angina pectoris: Secondary | ICD-10-CM | POA: Diagnosis not present

## 2017-11-14 DIAGNOSIS — G629 Polyneuropathy, unspecified: Secondary | ICD-10-CM | POA: Diagnosis not present

## 2017-11-14 DIAGNOSIS — N183 Chronic kidney disease, stage 3 (moderate): Secondary | ICD-10-CM | POA: Diagnosis not present

## 2017-11-18 DIAGNOSIS — M1991 Primary osteoarthritis, unspecified site: Secondary | ICD-10-CM | POA: Diagnosis not present

## 2017-11-18 DIAGNOSIS — M10371 Gout due to renal impairment, right ankle and foot: Secondary | ICD-10-CM | POA: Diagnosis not present

## 2017-11-18 DIAGNOSIS — I5032 Chronic diastolic (congestive) heart failure: Secondary | ICD-10-CM | POA: Diagnosis not present

## 2017-11-18 DIAGNOSIS — G629 Polyneuropathy, unspecified: Secondary | ICD-10-CM | POA: Diagnosis not present

## 2017-11-18 DIAGNOSIS — J8410 Pulmonary fibrosis, unspecified: Secondary | ICD-10-CM | POA: Diagnosis not present

## 2017-11-18 DIAGNOSIS — I38 Endocarditis, valve unspecified: Secondary | ICD-10-CM | POA: Diagnosis not present

## 2017-11-18 DIAGNOSIS — I2510 Atherosclerotic heart disease of native coronary artery without angina pectoris: Secondary | ICD-10-CM | POA: Diagnosis not present

## 2017-11-18 DIAGNOSIS — M810 Age-related osteoporosis without current pathological fracture: Secondary | ICD-10-CM | POA: Diagnosis not present

## 2017-11-18 DIAGNOSIS — D631 Anemia in chronic kidney disease: Secondary | ICD-10-CM | POA: Diagnosis not present

## 2017-11-18 DIAGNOSIS — N183 Chronic kidney disease, stage 3 (moderate): Secondary | ICD-10-CM | POA: Diagnosis not present

## 2017-11-18 DIAGNOSIS — I130 Hypertensive heart and chronic kidney disease with heart failure and stage 1 through stage 4 chronic kidney disease, or unspecified chronic kidney disease: Secondary | ICD-10-CM | POA: Diagnosis not present

## 2017-11-18 DIAGNOSIS — I350 Nonrheumatic aortic (valve) stenosis: Secondary | ICD-10-CM | POA: Diagnosis not present

## 2017-11-18 DIAGNOSIS — K5790 Diverticulosis of intestine, part unspecified, without perforation or abscess without bleeding: Secondary | ICD-10-CM | POA: Diagnosis not present

## 2017-11-19 DIAGNOSIS — M10371 Gout due to renal impairment, right ankle and foot: Secondary | ICD-10-CM | POA: Diagnosis not present

## 2017-11-19 DIAGNOSIS — I38 Endocarditis, valve unspecified: Secondary | ICD-10-CM | POA: Diagnosis not present

## 2017-11-19 DIAGNOSIS — K5790 Diverticulosis of intestine, part unspecified, without perforation or abscess without bleeding: Secondary | ICD-10-CM | POA: Diagnosis not present

## 2017-11-19 DIAGNOSIS — I130 Hypertensive heart and chronic kidney disease with heart failure and stage 1 through stage 4 chronic kidney disease, or unspecified chronic kidney disease: Secondary | ICD-10-CM | POA: Diagnosis not present

## 2017-11-19 DIAGNOSIS — N183 Chronic kidney disease, stage 3 (moderate): Secondary | ICD-10-CM | POA: Diagnosis not present

## 2017-11-19 DIAGNOSIS — M1991 Primary osteoarthritis, unspecified site: Secondary | ICD-10-CM | POA: Diagnosis not present

## 2017-11-19 DIAGNOSIS — D631 Anemia in chronic kidney disease: Secondary | ICD-10-CM | POA: Diagnosis not present

## 2017-11-19 DIAGNOSIS — I5032 Chronic diastolic (congestive) heart failure: Secondary | ICD-10-CM | POA: Diagnosis not present

## 2017-11-19 DIAGNOSIS — J8410 Pulmonary fibrosis, unspecified: Secondary | ICD-10-CM | POA: Diagnosis not present

## 2017-11-19 DIAGNOSIS — I350 Nonrheumatic aortic (valve) stenosis: Secondary | ICD-10-CM | POA: Diagnosis not present

## 2017-11-19 DIAGNOSIS — M810 Age-related osteoporosis without current pathological fracture: Secondary | ICD-10-CM | POA: Diagnosis not present

## 2017-11-19 DIAGNOSIS — I2510 Atherosclerotic heart disease of native coronary artery without angina pectoris: Secondary | ICD-10-CM | POA: Diagnosis not present

## 2017-11-19 DIAGNOSIS — G629 Polyneuropathy, unspecified: Secondary | ICD-10-CM | POA: Diagnosis not present

## 2017-11-20 DIAGNOSIS — D631 Anemia in chronic kidney disease: Secondary | ICD-10-CM | POA: Diagnosis not present

## 2017-11-20 DIAGNOSIS — I130 Hypertensive heart and chronic kidney disease with heart failure and stage 1 through stage 4 chronic kidney disease, or unspecified chronic kidney disease: Secondary | ICD-10-CM | POA: Diagnosis not present

## 2017-11-20 DIAGNOSIS — N183 Chronic kidney disease, stage 3 (moderate): Secondary | ICD-10-CM | POA: Diagnosis not present

## 2017-11-20 DIAGNOSIS — I5032 Chronic diastolic (congestive) heart failure: Secondary | ICD-10-CM | POA: Diagnosis not present

## 2017-11-20 DIAGNOSIS — I2510 Atherosclerotic heart disease of native coronary artery without angina pectoris: Secondary | ICD-10-CM | POA: Diagnosis not present

## 2017-11-20 DIAGNOSIS — J8410 Pulmonary fibrosis, unspecified: Secondary | ICD-10-CM | POA: Diagnosis not present

## 2017-11-20 DIAGNOSIS — M810 Age-related osteoporosis without current pathological fracture: Secondary | ICD-10-CM | POA: Diagnosis not present

## 2017-11-20 DIAGNOSIS — M1991 Primary osteoarthritis, unspecified site: Secondary | ICD-10-CM | POA: Diagnosis not present

## 2017-11-20 DIAGNOSIS — M10371 Gout due to renal impairment, right ankle and foot: Secondary | ICD-10-CM | POA: Diagnosis not present

## 2017-11-20 DIAGNOSIS — K5790 Diverticulosis of intestine, part unspecified, without perforation or abscess without bleeding: Secondary | ICD-10-CM | POA: Diagnosis not present

## 2017-11-20 DIAGNOSIS — I350 Nonrheumatic aortic (valve) stenosis: Secondary | ICD-10-CM | POA: Diagnosis not present

## 2017-11-20 DIAGNOSIS — G629 Polyneuropathy, unspecified: Secondary | ICD-10-CM | POA: Diagnosis not present

## 2017-11-20 DIAGNOSIS — I38 Endocarditis, valve unspecified: Secondary | ICD-10-CM | POA: Diagnosis not present

## 2017-11-21 DIAGNOSIS — K5790 Diverticulosis of intestine, part unspecified, without perforation or abscess without bleeding: Secondary | ICD-10-CM | POA: Diagnosis not present

## 2017-11-21 DIAGNOSIS — M810 Age-related osteoporosis without current pathological fracture: Secondary | ICD-10-CM | POA: Diagnosis not present

## 2017-11-21 DIAGNOSIS — I350 Nonrheumatic aortic (valve) stenosis: Secondary | ICD-10-CM | POA: Diagnosis not present

## 2017-11-21 DIAGNOSIS — N183 Chronic kidney disease, stage 3 (moderate): Secondary | ICD-10-CM | POA: Diagnosis not present

## 2017-11-21 DIAGNOSIS — I130 Hypertensive heart and chronic kidney disease with heart failure and stage 1 through stage 4 chronic kidney disease, or unspecified chronic kidney disease: Secondary | ICD-10-CM | POA: Diagnosis not present

## 2017-11-21 DIAGNOSIS — G629 Polyneuropathy, unspecified: Secondary | ICD-10-CM | POA: Diagnosis not present

## 2017-11-21 DIAGNOSIS — M10371 Gout due to renal impairment, right ankle and foot: Secondary | ICD-10-CM | POA: Diagnosis not present

## 2017-11-21 DIAGNOSIS — D631 Anemia in chronic kidney disease: Secondary | ICD-10-CM | POA: Diagnosis not present

## 2017-11-21 DIAGNOSIS — I38 Endocarditis, valve unspecified: Secondary | ICD-10-CM | POA: Diagnosis not present

## 2017-11-21 DIAGNOSIS — I2510 Atherosclerotic heart disease of native coronary artery without angina pectoris: Secondary | ICD-10-CM | POA: Diagnosis not present

## 2017-11-21 DIAGNOSIS — I5032 Chronic diastolic (congestive) heart failure: Secondary | ICD-10-CM | POA: Diagnosis not present

## 2017-11-21 DIAGNOSIS — J449 Chronic obstructive pulmonary disease, unspecified: Secondary | ICD-10-CM | POA: Diagnosis not present

## 2017-11-21 DIAGNOSIS — M1991 Primary osteoarthritis, unspecified site: Secondary | ICD-10-CM | POA: Diagnosis not present

## 2017-11-21 DIAGNOSIS — J8410 Pulmonary fibrosis, unspecified: Secondary | ICD-10-CM | POA: Diagnosis not present

## 2017-11-22 DIAGNOSIS — I5032 Chronic diastolic (congestive) heart failure: Secondary | ICD-10-CM | POA: Diagnosis not present

## 2017-11-22 DIAGNOSIS — M1991 Primary osteoarthritis, unspecified site: Secondary | ICD-10-CM | POA: Diagnosis not present

## 2017-11-22 DIAGNOSIS — I130 Hypertensive heart and chronic kidney disease with heart failure and stage 1 through stage 4 chronic kidney disease, or unspecified chronic kidney disease: Secondary | ICD-10-CM | POA: Diagnosis not present

## 2017-11-22 DIAGNOSIS — G629 Polyneuropathy, unspecified: Secondary | ICD-10-CM | POA: Diagnosis not present

## 2017-11-22 DIAGNOSIS — M810 Age-related osteoporosis without current pathological fracture: Secondary | ICD-10-CM | POA: Diagnosis not present

## 2017-11-22 DIAGNOSIS — M10371 Gout due to renal impairment, right ankle and foot: Secondary | ICD-10-CM | POA: Diagnosis not present

## 2017-11-22 DIAGNOSIS — D631 Anemia in chronic kidney disease: Secondary | ICD-10-CM | POA: Diagnosis not present

## 2017-11-22 DIAGNOSIS — I2510 Atherosclerotic heart disease of native coronary artery without angina pectoris: Secondary | ICD-10-CM | POA: Diagnosis not present

## 2017-11-22 DIAGNOSIS — J8410 Pulmonary fibrosis, unspecified: Secondary | ICD-10-CM | POA: Diagnosis not present

## 2017-11-22 DIAGNOSIS — I38 Endocarditis, valve unspecified: Secondary | ICD-10-CM | POA: Diagnosis not present

## 2017-11-22 DIAGNOSIS — N183 Chronic kidney disease, stage 3 (moderate): Secondary | ICD-10-CM | POA: Diagnosis not present

## 2017-11-22 DIAGNOSIS — I350 Nonrheumatic aortic (valve) stenosis: Secondary | ICD-10-CM | POA: Diagnosis not present

## 2017-11-22 DIAGNOSIS — K5790 Diverticulosis of intestine, part unspecified, without perforation or abscess without bleeding: Secondary | ICD-10-CM | POA: Diagnosis not present

## 2017-11-25 DIAGNOSIS — M810 Age-related osteoporosis without current pathological fracture: Secondary | ICD-10-CM | POA: Diagnosis not present

## 2017-11-25 DIAGNOSIS — G629 Polyneuropathy, unspecified: Secondary | ICD-10-CM | POA: Diagnosis not present

## 2017-11-25 DIAGNOSIS — I130 Hypertensive heart and chronic kidney disease with heart failure and stage 1 through stage 4 chronic kidney disease, or unspecified chronic kidney disease: Secondary | ICD-10-CM | POA: Diagnosis not present

## 2017-11-25 DIAGNOSIS — I38 Endocarditis, valve unspecified: Secondary | ICD-10-CM | POA: Diagnosis not present

## 2017-11-25 DIAGNOSIS — N183 Chronic kidney disease, stage 3 (moderate): Secondary | ICD-10-CM | POA: Diagnosis not present

## 2017-11-25 DIAGNOSIS — M1991 Primary osteoarthritis, unspecified site: Secondary | ICD-10-CM | POA: Diagnosis not present

## 2017-11-25 DIAGNOSIS — D631 Anemia in chronic kidney disease: Secondary | ICD-10-CM | POA: Diagnosis not present

## 2017-11-25 DIAGNOSIS — M10371 Gout due to renal impairment, right ankle and foot: Secondary | ICD-10-CM | POA: Diagnosis not present

## 2017-11-25 DIAGNOSIS — K5790 Diverticulosis of intestine, part unspecified, without perforation or abscess without bleeding: Secondary | ICD-10-CM | POA: Diagnosis not present

## 2017-11-25 DIAGNOSIS — I5032 Chronic diastolic (congestive) heart failure: Secondary | ICD-10-CM | POA: Diagnosis not present

## 2017-11-25 DIAGNOSIS — I350 Nonrheumatic aortic (valve) stenosis: Secondary | ICD-10-CM | POA: Diagnosis not present

## 2017-11-25 DIAGNOSIS — J8410 Pulmonary fibrosis, unspecified: Secondary | ICD-10-CM | POA: Diagnosis not present

## 2017-11-25 DIAGNOSIS — I2510 Atherosclerotic heart disease of native coronary artery without angina pectoris: Secondary | ICD-10-CM | POA: Diagnosis not present

## 2017-11-27 DIAGNOSIS — I38 Endocarditis, valve unspecified: Secondary | ICD-10-CM | POA: Diagnosis not present

## 2017-11-27 DIAGNOSIS — G629 Polyneuropathy, unspecified: Secondary | ICD-10-CM | POA: Diagnosis not present

## 2017-11-27 DIAGNOSIS — I5032 Chronic diastolic (congestive) heart failure: Secondary | ICD-10-CM | POA: Diagnosis not present

## 2017-11-27 DIAGNOSIS — K5790 Diverticulosis of intestine, part unspecified, without perforation or abscess without bleeding: Secondary | ICD-10-CM | POA: Diagnosis not present

## 2017-11-27 DIAGNOSIS — M810 Age-related osteoporosis without current pathological fracture: Secondary | ICD-10-CM | POA: Diagnosis not present

## 2017-11-27 DIAGNOSIS — I2510 Atherosclerotic heart disease of native coronary artery without angina pectoris: Secondary | ICD-10-CM | POA: Diagnosis not present

## 2017-11-27 DIAGNOSIS — J8410 Pulmonary fibrosis, unspecified: Secondary | ICD-10-CM | POA: Diagnosis not present

## 2017-11-27 DIAGNOSIS — D631 Anemia in chronic kidney disease: Secondary | ICD-10-CM | POA: Diagnosis not present

## 2017-11-27 DIAGNOSIS — I350 Nonrheumatic aortic (valve) stenosis: Secondary | ICD-10-CM | POA: Diagnosis not present

## 2017-11-27 DIAGNOSIS — N183 Chronic kidney disease, stage 3 (moderate): Secondary | ICD-10-CM | POA: Diagnosis not present

## 2017-11-27 DIAGNOSIS — M1991 Primary osteoarthritis, unspecified site: Secondary | ICD-10-CM | POA: Diagnosis not present

## 2017-11-27 DIAGNOSIS — M10371 Gout due to renal impairment, right ankle and foot: Secondary | ICD-10-CM | POA: Diagnosis not present

## 2017-11-27 DIAGNOSIS — I130 Hypertensive heart and chronic kidney disease with heart failure and stage 1 through stage 4 chronic kidney disease, or unspecified chronic kidney disease: Secondary | ICD-10-CM | POA: Diagnosis not present

## 2017-11-28 DIAGNOSIS — M10371 Gout due to renal impairment, right ankle and foot: Secondary | ICD-10-CM | POA: Diagnosis not present

## 2017-11-28 DIAGNOSIS — I2510 Atherosclerotic heart disease of native coronary artery without angina pectoris: Secondary | ICD-10-CM | POA: Diagnosis not present

## 2017-11-28 DIAGNOSIS — K5790 Diverticulosis of intestine, part unspecified, without perforation or abscess without bleeding: Secondary | ICD-10-CM | POA: Diagnosis not present

## 2017-11-28 DIAGNOSIS — G629 Polyneuropathy, unspecified: Secondary | ICD-10-CM | POA: Diagnosis not present

## 2017-11-28 DIAGNOSIS — I5032 Chronic diastolic (congestive) heart failure: Secondary | ICD-10-CM | POA: Diagnosis not present

## 2017-11-28 DIAGNOSIS — M1991 Primary osteoarthritis, unspecified site: Secondary | ICD-10-CM | POA: Diagnosis not present

## 2017-11-28 DIAGNOSIS — I350 Nonrheumatic aortic (valve) stenosis: Secondary | ICD-10-CM | POA: Diagnosis not present

## 2017-11-28 DIAGNOSIS — D631 Anemia in chronic kidney disease: Secondary | ICD-10-CM | POA: Diagnosis not present

## 2017-11-28 DIAGNOSIS — I130 Hypertensive heart and chronic kidney disease with heart failure and stage 1 through stage 4 chronic kidney disease, or unspecified chronic kidney disease: Secondary | ICD-10-CM | POA: Diagnosis not present

## 2017-11-28 DIAGNOSIS — N183 Chronic kidney disease, stage 3 (moderate): Secondary | ICD-10-CM | POA: Diagnosis not present

## 2017-11-28 DIAGNOSIS — I38 Endocarditis, valve unspecified: Secondary | ICD-10-CM | POA: Diagnosis not present

## 2017-11-28 DIAGNOSIS — J8410 Pulmonary fibrosis, unspecified: Secondary | ICD-10-CM | POA: Diagnosis not present

## 2017-11-28 DIAGNOSIS — M810 Age-related osteoporosis without current pathological fracture: Secondary | ICD-10-CM | POA: Diagnosis not present

## 2017-11-29 DIAGNOSIS — M10371 Gout due to renal impairment, right ankle and foot: Secondary | ICD-10-CM | POA: Diagnosis not present

## 2017-11-29 DIAGNOSIS — K5790 Diverticulosis of intestine, part unspecified, without perforation or abscess without bleeding: Secondary | ICD-10-CM | POA: Diagnosis not present

## 2017-11-29 DIAGNOSIS — I350 Nonrheumatic aortic (valve) stenosis: Secondary | ICD-10-CM | POA: Diagnosis not present

## 2017-11-29 DIAGNOSIS — D631 Anemia in chronic kidney disease: Secondary | ICD-10-CM | POA: Diagnosis not present

## 2017-11-29 DIAGNOSIS — G629 Polyneuropathy, unspecified: Secondary | ICD-10-CM | POA: Diagnosis not present

## 2017-11-29 DIAGNOSIS — J189 Pneumonia, unspecified organism: Secondary | ICD-10-CM | POA: Diagnosis not present

## 2017-11-29 DIAGNOSIS — I2510 Atherosclerotic heart disease of native coronary artery without angina pectoris: Secondary | ICD-10-CM | POA: Diagnosis not present

## 2017-11-29 DIAGNOSIS — J8410 Pulmonary fibrosis, unspecified: Secondary | ICD-10-CM | POA: Diagnosis not present

## 2017-11-29 DIAGNOSIS — I130 Hypertensive heart and chronic kidney disease with heart failure and stage 1 through stage 4 chronic kidney disease, or unspecified chronic kidney disease: Secondary | ICD-10-CM | POA: Diagnosis not present

## 2017-11-29 DIAGNOSIS — M810 Age-related osteoporosis without current pathological fracture: Secondary | ICD-10-CM | POA: Diagnosis not present

## 2017-11-29 DIAGNOSIS — I5032 Chronic diastolic (congestive) heart failure: Secondary | ICD-10-CM | POA: Diagnosis not present

## 2017-11-29 DIAGNOSIS — N183 Chronic kidney disease, stage 3 (moderate): Secondary | ICD-10-CM | POA: Diagnosis not present

## 2017-11-29 DIAGNOSIS — I38 Endocarditis, valve unspecified: Secondary | ICD-10-CM | POA: Diagnosis not present

## 2017-11-29 DIAGNOSIS — M1991 Primary osteoarthritis, unspecified site: Secondary | ICD-10-CM | POA: Diagnosis not present

## 2017-12-10 DIAGNOSIS — I130 Hypertensive heart and chronic kidney disease with heart failure and stage 1 through stage 4 chronic kidney disease, or unspecified chronic kidney disease: Secondary | ICD-10-CM | POA: Diagnosis not present

## 2017-12-10 DIAGNOSIS — M1991 Primary osteoarthritis, unspecified site: Secondary | ICD-10-CM | POA: Diagnosis not present

## 2017-12-10 DIAGNOSIS — I5032 Chronic diastolic (congestive) heart failure: Secondary | ICD-10-CM | POA: Diagnosis not present

## 2017-12-10 DIAGNOSIS — N183 Chronic kidney disease, stage 3 (moderate): Secondary | ICD-10-CM | POA: Diagnosis not present

## 2017-12-10 DIAGNOSIS — D631 Anemia in chronic kidney disease: Secondary | ICD-10-CM | POA: Diagnosis not present

## 2017-12-12 DIAGNOSIS — I5032 Chronic diastolic (congestive) heart failure: Secondary | ICD-10-CM | POA: Diagnosis not present

## 2017-12-12 DIAGNOSIS — M1990 Unspecified osteoarthritis, unspecified site: Secondary | ICD-10-CM | POA: Diagnosis not present

## 2017-12-12 DIAGNOSIS — N183 Chronic kidney disease, stage 3 (moderate): Secondary | ICD-10-CM | POA: Diagnosis not present

## 2017-12-12 DIAGNOSIS — I130 Hypertensive heart and chronic kidney disease with heart failure and stage 1 through stage 4 chronic kidney disease, or unspecified chronic kidney disease: Secondary | ICD-10-CM | POA: Diagnosis not present

## 2017-12-12 DIAGNOSIS — D631 Anemia in chronic kidney disease: Secondary | ICD-10-CM | POA: Diagnosis not present

## 2017-12-22 DIAGNOSIS — J449 Chronic obstructive pulmonary disease, unspecified: Secondary | ICD-10-CM | POA: Diagnosis not present

## 2017-12-30 DIAGNOSIS — J189 Pneumonia, unspecified organism: Secondary | ICD-10-CM | POA: Diagnosis not present

## 2018-01-15 DIAGNOSIS — Z933 Colostomy status: Secondary | ICD-10-CM | POA: Diagnosis not present

## 2018-01-17 ENCOUNTER — Encounter: Payer: Self-pay | Admitting: Internal Medicine

## 2018-01-17 ENCOUNTER — Inpatient Hospital Stay
Admission: EM | Admit: 2018-01-17 | Discharge: 2018-01-20 | DRG: 202 | Disposition: A | Discharge status: Skilled Nursing Facility | Payer: Medicare Other | Attending: Internal Medicine | Admitting: Internal Medicine

## 2018-01-17 ENCOUNTER — Other Ambulatory Visit: Payer: Self-pay

## 2018-01-17 ENCOUNTER — Emergency Department: Payer: Medicare Other

## 2018-01-17 DIAGNOSIS — K702 Alcoholic fibrosis and sclerosis of liver: Secondary | ICD-10-CM | POA: Diagnosis present

## 2018-01-17 DIAGNOSIS — Z880 Allergy status to penicillin: Secondary | ICD-10-CM

## 2018-01-17 DIAGNOSIS — I4510 Unspecified right bundle-branch block: Secondary | ICD-10-CM | POA: Diagnosis present

## 2018-01-17 DIAGNOSIS — Z7982 Long term (current) use of aspirin: Secondary | ICD-10-CM

## 2018-01-17 DIAGNOSIS — N184 Chronic kidney disease, stage 4 (severe): Secondary | ICD-10-CM | POA: Diagnosis present

## 2018-01-17 DIAGNOSIS — M6281 Muscle weakness (generalized): Secondary | ICD-10-CM | POA: Diagnosis not present

## 2018-01-17 DIAGNOSIS — J45901 Unspecified asthma with (acute) exacerbation: Secondary | ICD-10-CM | POA: Diagnosis not present

## 2018-01-17 DIAGNOSIS — I248 Other forms of acute ischemic heart disease: Secondary | ICD-10-CM | POA: Diagnosis present

## 2018-01-17 DIAGNOSIS — J209 Acute bronchitis, unspecified: Secondary | ICD-10-CM | POA: Diagnosis not present

## 2018-01-17 DIAGNOSIS — Z66 Do not resuscitate: Secondary | ICD-10-CM | POA: Diagnosis present

## 2018-01-17 DIAGNOSIS — R0902 Hypoxemia: Secondary | ICD-10-CM

## 2018-01-17 DIAGNOSIS — N179 Acute kidney failure, unspecified: Secondary | ICD-10-CM | POA: Diagnosis not present

## 2018-01-17 DIAGNOSIS — Z9842 Cataract extraction status, left eye: Secondary | ICD-10-CM | POA: Diagnosis not present

## 2018-01-17 DIAGNOSIS — R0602 Shortness of breath: Secondary | ICD-10-CM | POA: Diagnosis not present

## 2018-01-17 DIAGNOSIS — R531 Weakness: Secondary | ICD-10-CM | POA: Diagnosis not present

## 2018-01-17 DIAGNOSIS — Z9841 Cataract extraction status, right eye: Secondary | ICD-10-CM | POA: Diagnosis not present

## 2018-01-17 DIAGNOSIS — I129 Hypertensive chronic kidney disease with stage 1 through stage 4 chronic kidney disease, or unspecified chronic kidney disease: Secondary | ICD-10-CM | POA: Diagnosis present

## 2018-01-17 DIAGNOSIS — J441 Chronic obstructive pulmonary disease with (acute) exacerbation: Secondary | ICD-10-CM | POA: Diagnosis not present

## 2018-01-17 DIAGNOSIS — R05 Cough: Secondary | ICD-10-CM | POA: Diagnosis not present

## 2018-01-17 DIAGNOSIS — Z9981 Dependence on supplemental oxygen: Secondary | ICD-10-CM

## 2018-01-17 DIAGNOSIS — Z7401 Bed confinement status: Secondary | ICD-10-CM | POA: Diagnosis not present

## 2018-01-17 DIAGNOSIS — Z79899 Other long term (current) drug therapy: Secondary | ICD-10-CM | POA: Diagnosis not present

## 2018-01-17 DIAGNOSIS — R748 Abnormal levels of other serum enzymes: Secondary | ICD-10-CM | POA: Diagnosis not present

## 2018-01-17 HISTORY — DX: Unspecified asthma with (acute) exacerbation: J45.901

## 2018-01-17 LAB — COMPREHENSIVE METABOLIC PANEL
ALK PHOS: 58 U/L (ref 38–126)
ALT: 10 U/L (ref 0–44)
AST: 26 U/L (ref 15–41)
Albumin: 3.2 g/dL — ABNORMAL LOW (ref 3.5–5.0)
Anion gap: 8 (ref 5–15)
BILIRUBIN TOTAL: 0.7 mg/dL (ref 0.3–1.2)
BUN: 50 mg/dL — AB (ref 8–23)
CALCIUM: 8.8 mg/dL — AB (ref 8.9–10.3)
CO2: 28 mmol/L (ref 22–32)
CREATININE: 2.14 mg/dL — AB (ref 0.44–1.00)
Chloride: 109 mmol/L (ref 98–111)
GFR calc Af Amer: 19 mL/min — ABNORMAL LOW (ref >60)
GFR, EST NON AFRICAN AMERICAN: 17 mL/min — AB (ref >60)
GLUCOSE: 149 mg/dL — AB (ref 70–99)
Potassium: 4.8 mmol/L (ref 3.5–5.1)
Sodium: 145 mmol/L (ref 135–145)
Total Protein: 7.4 g/dL (ref 6.5–8.1)

## 2018-01-17 LAB — CBC WITH DIFFERENTIAL/PLATELET
BASOS ABS: 0.1 10*3/uL (ref 0–0.1)
Basophils Relative: 1 %
EOS PCT: 0 %
Eosinophils Absolute: 0 10*3/uL (ref 0–0.7)
HEMATOCRIT: 32.7 % — AB (ref 35.0–47.0)
Hemoglobin: 10.2 g/dL — ABNORMAL LOW (ref 12.0–16.0)
Lymphocytes Relative: 7 %
Lymphs Abs: 0.8 10*3/uL — ABNORMAL LOW (ref 1.0–3.6)
MCH: 26.6 pg (ref 26.0–34.0)
MCHC: 31.4 g/dL — ABNORMAL LOW (ref 32.0–36.0)
MCV: 84.8 fL (ref 80.0–100.0)
MONO ABS: 0.7 10*3/uL (ref 0.2–0.9)
MONOS PCT: 6 %
Neutro Abs: 9.9 10*3/uL — ABNORMAL HIGH (ref 1.4–6.5)
Neutrophils Relative %: 86 %
PLATELETS: 189 10*3/uL (ref 150–440)
RBC: 3.85 MIL/uL (ref 3.80–5.20)
RDW: 15.2 % — AB (ref 11.5–14.5)
WBC: 11.6 10*3/uL — ABNORMAL HIGH (ref 3.6–11.0)

## 2018-01-17 LAB — BRAIN NATRIURETIC PEPTIDE: B Natriuretic Peptide: 239 pg/mL — ABNORMAL HIGH (ref 0.0–100.0)

## 2018-01-17 LAB — TSH: TSH: 0.707 u[IU]/mL (ref 0.350–4.500)

## 2018-01-17 LAB — TROPONIN I: TROPONIN I: 0.07 ng/mL — AB (ref <0.03)

## 2018-01-17 MED ORDER — ORAL CARE MOUTH RINSE
15.0000 mL | Freq: Two times a day (BID) | OROMUCOSAL | Status: DC
  Administered 2018-01-17 – 2018-01-20 (×5): 15 mL via OROMUCOSAL

## 2018-01-17 MED ORDER — IPRATROPIUM-ALBUTEROL 0.5-2.5 (3) MG/3ML IN SOLN
3.0000 mL | Freq: Once | RESPIRATORY_TRACT | Status: AC
  Administered 2018-01-17: 3 mL via RESPIRATORY_TRACT
  Filled 2018-01-17: qty 3

## 2018-01-17 MED ORDER — ACETAMINOPHEN 650 MG RE SUPP: 650.0000 mg | Freq: Four times a day (QID) | RECTAL | Status: DC | PRN

## 2018-01-17 MED ORDER — BISACODYL 5 MG PO TBEC: 5.0000 mg | DELAYED_RELEASE_TABLET | Freq: Every day | ORAL | Status: DC | PRN

## 2018-01-17 MED ORDER — LORAZEPAM 0.5 MG PO TABS
0.2500 mg | ORAL_TABLET | Freq: Every day | ORAL | Status: DC
  Administered 2018-01-17: 0.25 mg via ORAL
  Filled 2018-01-17: qty 1

## 2018-01-17 MED ORDER — METHYLPREDNISOLONE SODIUM SUCC 125 MG IJ SOLR
125.0000 mg | Freq: Once | INTRAMUSCULAR | Status: AC
  Administered 2018-01-17: 125 mg via INTRAVENOUS
  Filled 2018-01-17: qty 2

## 2018-01-17 MED ORDER — DOCUSATE SODIUM 100 MG PO CAPS
100.0000 mg | ORAL_CAPSULE | Freq: Two times a day (BID) | ORAL | Status: DC
  Administered 2018-01-17 – 2018-01-19 (×4): 100 mg via ORAL
  Filled 2018-01-17 (×6): qty 1

## 2018-01-17 MED ORDER — VITAMIN D 1000 UNITS PO TABS
1000.0000 [IU] | ORAL_TABLET | Freq: Every day | ORAL | Status: DC
  Administered 2018-01-18 – 2018-01-20 (×3): 1000 [IU] via ORAL
  Filled 2018-01-17 (×3): qty 1

## 2018-01-17 MED ORDER — FERROUS SULFATE 325 (65 FE) MG PO TABS
325.0000 mg | ORAL_TABLET | Freq: Two times a day (BID) | ORAL | Status: DC
  Administered 2018-01-17 – 2018-01-20 (×5): 325 mg via ORAL
  Filled 2018-01-17 (×5): qty 1

## 2018-01-17 MED ORDER — PREDNISONE 10 MG PO TABS
5.0000 mg | ORAL_TABLET | Freq: Every day | ORAL | Status: DC
  Administered 2018-01-18 – 2018-01-20 (×3): 5 mg via ORAL
  Filled 2018-01-17 (×3): qty 1

## 2018-01-17 MED ORDER — HEPARIN SODIUM (PORCINE) 5000 UNIT/ML IJ SOLN
5000.0000 [IU] | Freq: Three times a day (TID) | INTRAMUSCULAR | Status: DC
Start: 2018-01-17 — End: 2018-01-20
  Administered 2018-01-17 – 2018-01-20 (×8): 5000 [IU] via SUBCUTANEOUS
  Filled 2018-01-17 (×6): qty 1

## 2018-01-17 MED ORDER — IPRATROPIUM-ALBUTEROL 0.5-2.5 (3) MG/3ML IN SOLN
3.0000 mL | Freq: Four times a day (QID) | RESPIRATORY_TRACT | Status: DC
  Administered 2018-01-17 – 2018-01-18 (×5): 3 mL via RESPIRATORY_TRACT
  Filled 2018-01-17 (×5): qty 3

## 2018-01-17 MED ORDER — ONDANSETRON HCL 4 MG PO TABS: 4.0000 mg | ORAL_TABLET | Freq: Four times a day (QID) | ORAL | Status: DC | PRN

## 2018-01-17 MED ORDER — CALCIUM-MAGNESIUM-ZINC PO TABS: 1.0000 | ORAL_TABLET | Freq: Every day | ORAL | Status: DC

## 2018-01-17 MED ORDER — ACETAMINOPHEN 500 MG PO TABS: 500.0000 mg | ORAL_TABLET | Freq: Four times a day (QID) | ORAL | Status: DC | PRN

## 2018-01-17 MED ORDER — PANTOPRAZOLE SODIUM 40 MG PO TBEC
40.0000 mg | DELAYED_RELEASE_TABLET | Freq: Every day | ORAL | Status: DC
  Administered 2018-01-18 – 2018-01-20 (×3): 40 mg via ORAL
  Filled 2018-01-17 (×3): qty 1

## 2018-01-17 MED ORDER — HYDROCODONE-ACETAMINOPHEN 5-325 MG PO TABS: 1.0000 | ORAL_TABLET | ORAL | Status: DC | PRN

## 2018-01-17 MED ORDER — SODIUM CHLORIDE 0.9 % IV SOLN
INTRAVENOUS | Status: DC
  Administered 2018-01-17: 19:00:00 via INTRAVENOUS

## 2018-01-17 MED ORDER — RISPERIDONE 0.25 MG PO TABS
0.2500 mg | ORAL_TABLET | Freq: Three times a day (TID) | ORAL | Status: DC
  Administered 2018-01-17 – 2018-01-20 (×7): 0.25 mg via ORAL
  Filled 2018-01-17 (×10): qty 1

## 2018-01-17 MED ORDER — TRAZODONE HCL 50 MG PO TABS
25.0000 mg | ORAL_TABLET | Freq: Every evening | ORAL | Status: DC | PRN
  Administered 2018-01-18 – 2018-01-19 (×2): 25 mg via ORAL
  Filled 2018-01-17 (×2): qty 1

## 2018-01-17 MED ORDER — ONDANSETRON HCL 4 MG/2ML IJ SOLN: 4.0000 mg | Freq: Four times a day (QID) | INTRAMUSCULAR | Status: DC | PRN

## 2018-01-17 MED ORDER — ACETAMINOPHEN 325 MG PO TABS
650.0000 mg | ORAL_TABLET | Freq: Four times a day (QID) | ORAL | Status: DC | PRN
  Administered 2018-01-18 – 2018-01-19 (×2): 650 mg via ORAL
  Filled 2018-01-17 (×2): qty 2

## 2018-01-17 MED ORDER — ASPIRIN EC 81 MG PO TBEC
81.0000 mg | DELAYED_RELEASE_TABLET | Freq: Every day | ORAL | Status: DC
  Administered 2018-01-18 – 2018-01-20 (×3): 81 mg via ORAL
  Filled 2018-01-17 (×3): qty 1

## 2018-01-17 MED ORDER — FUROSEMIDE 20 MG PO TABS
20.0000 mg | ORAL_TABLET | Freq: Every day | ORAL | Status: DC
  Administered 2018-01-18 – 2018-01-20 (×3): 20 mg via ORAL
  Filled 2018-01-17 (×3): qty 1

## 2018-01-17 NOTE — ED Notes (Signed)
Family requesting to not straight cath pt for urine. Will continue to reassess to see if pt is able to provide urine sample.

## 2018-01-17 NOTE — ED Notes (Signed)
Report given to Emily, RN.

## 2018-01-17 NOTE — ED Triage Notes (Signed)
Family called medic for weakness/AMS. Per medic report pt lethargic upon arrival. Dry hoarse cough for several days. Normally on 2L 02 at home, per family pts oxygen was accidentally removed and sats were at 79% upon medic arrival. 94% after non rebreather placed, after one albuterol neb pts sats at 100%. Currently on 3L.

## 2018-01-17 NOTE — H&P (Signed)
[employer] - [place] at [facility]   PATIENT NAME: Briana [name]    MR#:  [id number]  DATE OF BIRTH:  06-16-1908  DATE OF ADMISSION:  01/17/2018  PRIMARY CARE PHYSICIAN: Patrice ParadiseMcLaughlin, Miriam K, MD   REQUESTING/REFERRING PHYSICIAN: Dionne BucySiadecki, Sebastian, MD  CHIEF COMPLAINT:   Chief Complaint  Patient presents with  . Altered Mental Status    HISTORY OF PRESENT ILLNESS:  Briana Blendlene Bailly  is a [age] y.o. female with PMH as noted below who presents with altered mental status (more sleepy and reduced p.o. Intake) gradually over the last several days, associated with cough and increased shortness of breath.  The family noted that the patient was hypoxic to 79% today although she may have been off her oxygen at that time.  The patient is normally on 2 L at home.  While in the ED talking to patient's granddaughter she is worried about her mother who is being spending a lot of time with her grandmother Ms. Sossamon and feels like she will need to be placed to a facility. PAST MEDICAL HISTORY:   Past Medical History:  Diagnosis Date  . Allergy   . Anemia, unspecified   . Angina pectoris (HCC)   . Arthritis   . Asthma exacerbation 01/17/2018  . Asthma without status asthmaticus    unspecified  . Atherosclerotic heart disease   . Chronic kidney disease   . Diverticulosis   . HH (hiatus hernia)    colonoscopy 03/30/1999  . Hyperlipidemia, unspecified   . Hypertension   . On prednisone therapy   . Osteoarthritis   . Osteoporosis   . Peripheral neuropathy   . Pneumonia   . Pulmonary fibrosis (HCC)   . Temporal arteritis (HCC) 10/2013    PAST SURGICAL HISTORY:   Past Surgical History:  Procedure Laterality Date  . BLADDER SURGERY    . CATARACT EXTRACTION, BILATERAL    . COLON SURGERY    . COLONOSCOPY  03/30/1999  . COLOSTOMY Left 1952   due to diverticular disease  . UPPER GI ENDOSCOPY  03/30/1999    SOCIAL HISTORY:   Social History   Tobacco Use  . Smoking status:  Never Smoker  . Smokeless tobacco: Former NeurosurgeonUser    Types: Snuff  Substance Use Topics  . Alcohol use: No    Frequency: Never    FAMILY HISTORY:   Family History  Problem Relation Age of Onset  . Cancer Sister   . Emphysema Brother   . Liver disease Father     DRUG ALLERGIES:   Allergies  Allergen Reactions  . Amoxicillin Diarrhea    Has patient had a PCN reaction causing immediate rash, facial/tongue/throat swelling, SOB or lightheadedness with hypotension: No Has patient had a PCN reaction causing severe rash involving mucus membranes or skin necrosis: No Has patient had a PCN reaction that required hospitalization: No Has patient had a PCN reaction occurring within the last 10 years: No If all of the above answers are "NO", then may proceed with Cephalosporin use.    REVIEW OF SYSTEMS:   ROS -unable to obtain due to lethargy/mental status  MEDICATIONS AT HOME:   Prior to Admission medications   Medication Sig Start Date End Date Taking? Authorizing Provider  acetaminophen (TYLENOL) 500 MG tablet Take 500-1,000 mg by mouth every 6 (six) hours as needed for mild pain or headache.   Yes [provider]  aspirin EC 81 MG tablet Take 81 mg by mouth daily.  Yes [provider]  cholecalciferol (VITAMIN D) 1000 units tablet Take 1,000 Units by mouth daily.   Yes [provider]  esomeprazole (NEXIUM 24HR) 20 MG capsule Take 20 mg by mouth daily.    Yes [provider]  ferrous sulfate (FERROUSUL) 325 (65 FE) MG tablet Take 1 tablet (325 mg total) by mouth 2 (two) times daily with a meal. 12/03/14  Yes Altamese Dilling, MD  furosemide (LASIX) 40 MG tablet Take 20 mg by mouth daily.   Yes [provider]  LORazepam (ATIVAN) 0.5 MG tablet Take 0.25 mg by mouth at bedtime.   Yes [provider]  Multiple Minerals (CALCIUM-MAGNESIUM-ZINC) TABS Take 1 tablet by mouth daily.   Yes [provider]  predniSONE  (DELTASONE) 5 MG tablet Take 5 mg by mouth daily.   Yes [provider]  risperiDONE (RISPERDAL) 0.25 MG tablet Take 0.25 mg by mouth 3 (three) times daily.    Yes [provider]      VITAL SIGNS:  Blood pressure 122/65, pulse 98, temperature 98.4 F (36.9 C), temperature source Oral, resp. rate 18, SpO2 100 %.  PHYSICAL EXAMINATION:  Physical Exam  GENERAL:  84 y.o.-year-old patient lying in the bed with no acute distress.  EYES: Pupils equal, round, reactive to light and accommodation. No scleral icterus. Extraocular muscles intact.  HEENT: Head atraumatic, normocephalic. Oropharynx and nasopharynx clear.  NECK:  Supple, no jugular venous distention. No thyroid enlargement, no tenderness.  LUNGS: Decreased breath sounds bilaterally, no wheezing, rales,rhonchi or crepitation.  Using accessory muscles of respiration.  CARDIOVASCULAR: S1, S2 normal. No murmurs, rubs, or gallops.  ABDOMEN: Soft, nontender, nondistended. Bowel sounds present. No organomegaly or mass.  EXTREMITIES: No pedal edema, cyanosis, or clubbing.  NEUROLOGIC: Cranial nerves II through XII are intact. Muscle strength 5/5 in all extremities. Sensation intact. Gait not checked.  PSYCHIATRIC: The patient is somnolent SKIN: No obvious rash, lesion, or ulcer.   LABORATORY PANEL:   CBC Recent Labs  Lab 01/17/18 1425  WBC 11.6*  HGB 10.2*  HCT 32.7*  PLT 189   ------------------------------------------------------------------------------------------------------------------  Chemistries  Recent Labs  Lab 01/17/18 1425  NA 145  K 4.8  CL 109  CO2 28  GLUCOSE 149*  BUN 50*  CREATININE 2.14*  CALCIUM 8.8*  AST 26  ALT 10  ALKPHOS 58  BILITOT 0.7   ------------------------------------------------------------------------------------------------------------------  Cardiac Enzymes Recent Labs  Lab 01/17/18 1425  TROPONINI 0.07*    ------------------------------------------------------------------------------------------------------------------  RADIOLOGY:  Dg Chest Portable 1 View  Result Date: 01/17/2018 CLINICAL DATA:  Cough for several days. EXAM: PORTABLE CHEST 1 VIEW COMPARISON:  Numerous chest x-rays since 2012 FINDINGS: Density to the right of the trachea/mediastinum was noted to represent prominent vasculature on a CT scan from 2008. The cardiomediastinal silhouette is unchanged. Reticular opacities in the bases are consistent with the patient's known interstitial lung disease. No new infiltrate identified. No acute interval change. IMPRESSION: No acute interval changes. Electronically Signed   By: Gerome Sam III M.D   On: 01/17/2018 14:46   IMPRESSION AND PLAN:  84 year old female being admitted for possible asthma exacerbation/acute bronchitis  * Asthma exacerbation/acute bronchitis -Start DuoNeb breathing treatment -Avoid the Solu-Medrol and continue her home dose of prednisone 5 g daily  * Acute on Chronic kidney disease stage IV -Likely due to poor p.o. intake cannot rule out ATN or postobstructive -Start IV hydration, avoid nephrotoxic medications and monitor kidney function  *Elevated troponin -Likely due to demand ischemia, will  get serial troponin, monitor on off unit telemetry  *Weakness -We will get a physical therapy and Occupational Therapy consultation     All the records are reviewed and case discussed with ED provider. Management plans discussed with the patient, family (daughter and granddaughter at bedside) and they are in agreement.  CODE STATUS: DNR  TOTAL TIME TAKING CARE OF THIS PATIENT: 45 minutes.    Delfino Lovett M.D on 01/17/2018 at 5:14 PM  Between 7am to 6pm - Pager - [phone]  After 6pm go to [email or web address] - Social research officer, government  [facility] [place] [facility]  Office  [phone]  CC: Primary care physician; Patrice Paradise, MD   Note:  This dictation was prepared with Dragon dictation along with smaller phrase technology. Any transcriptional errors that result from this process are unintentional.

## 2018-01-17 NOTE — ED Notes (Signed)
Pt moved from hallway to room 6.

## 2018-01-17 NOTE — ED Notes (Signed)
Attempted to call report, asked to call back in 5 minutes. Will follow up.

## 2018-01-17 NOTE — ED Notes (Signed)
Date and time results received: 01/17/18 1514  Test: Troponin Critical Value: 0.07  Name of Provider Notified: Siadecki  Orders Received? Or Actions Taken?: No additional orders given at this time. Will continue to monitor.

## 2018-01-17 NOTE — Progress Notes (Signed)
Family Meeting Note  Advance Directive:yes  Today a meeting took place with the Patient and Daughter at bedside.  Patient is unable to participate due ZO:XWRUEAto:Lacked capacity AMS   The following clinical team members were present during this meeting:MD  The following were discussed:Patient's diagnosis:   [age] y.o. female with PMH as noted below who presents with altered mental status (more sleepy and reduced p.o. Intake) gradually over the last several days, associated with cough and increased shortness of breath. Overall poor prognosis due to advance age and her co morbidities.  Past Medical History:  Diagnosis Date  . Allergy   . Anemia, unspecified   . Angina pectoris (HCC)   . Arthritis   . Asthma exacerbation 01/17/2018  . Asthma without status asthmaticus    unspecified  . Atherosclerotic heart disease   . Chronic kidney disease   . Diverticulosis   . HH (hiatus hernia)    colonoscopy 03/30/1999  . Hyperlipidemia, unspecified   . Hypertension   . On prednisone therapy   . Osteoarthritis   . Osteoporosis   . Peripheral neuropathy   . Pneumonia   . Pulmonary fibrosis (HCC)   . Temporal arteritis (HCC) 10/2013      Patient's progosis: < 12 months and Goals for treatment: DNR  Additional follow-up to be provided: Palliative care c/s - consider Hospice at Home  Time spent during discussion:20 minutes  Delfino LovettVipul Katlin Ciszewski, MD

## 2018-01-17 NOTE — ED Provider Notes (Signed)
[facility] Emergency Department Provider Note ____________________________________________   First MD Initiated Contact with Patient 01/17/18 1408     (approximate)  I have reviewed the triage vital signs and the nursing notes.   HISTORY  Chief Complaint Altered Mental Status  Level 5 caveat: History of present illness limited due to altered mental status  HPI Briana [name] is a 84 y.o. female with PMH as noted below who presents with altered mental status gradually over the last several days, associated with cough and increased shortness of breath.  The family noted that the patient was hypoxic to 79% today although she may have been off her oxygen at that time.  The patient is normally on 2 L at home.  Past Medical History:  Diagnosis Date  . Allergy   . Anemia, unspecified   . Angina pectoris (HCC)   . Arthritis   . Asthma without status asthmaticus    unspecified  . Atherosclerotic heart disease   . Chronic kidney disease   . Diverticulosis   . HH (hiatus hernia)    colonoscopy 03/30/1999  . Hyperlipidemia, unspecified   . Hypertension   . On prednisone therapy   . Osteoarthritis   . Osteoporosis   . Peripheral neuropathy   . Pneumonia   . Pulmonary fibrosis (HCC)   . Temporal arteritis (HCC) 10/2013    Patient Active Problem List   Diagnosis Date Noted  . Hospice care 08/14/2017  . Altered mental status 08/13/2017  . GIB (gastrointestinal bleeding) 12/01/2014    Past Surgical History:  Procedure Laterality Date  . BLADDER SURGERY    . CATARACT EXTRACTION, BILATERAL    . COLON SURGERY    . COLONOSCOPY  03/30/1999  . COLOSTOMY Left 1952   due to diverticular disease  . UPPER GI ENDOSCOPY  03/30/1999    Prior to Admission medications   Medication Sig Start Date End Date Taking? Authorizing Provider  acetaminophen (TYLENOL) 500 MG tablet Take 500-1,000 mg by mouth every 6 (six) hours as needed for mild pain or headache.     [provider]  aspirin EC 81 MG tablet Take 81 mg by mouth daily.     [provider]  esomeprazole (NEXIUM 24HR) 20 MG capsule Take 20 mg by mouth daily.     [provider]  ferrous sulfate (FERROUSUL) 325 (65 FE) MG tablet Take 1 tablet (325 mg total) by mouth 2 (two) times daily with a meal. 12/03/14   Altamese Dilling, MD  risperiDONE (RISPERDAL) 0.25 MG tablet Take 0.25 mg by mouth 2 (two) times daily.    [provider]    Allergies Amoxicillin  Family History  Problem Relation Age of Onset  . Cancer Sister   . Emphysema Brother   . Liver disease Father     Social History Social History   Tobacco Use  . Smoking status: Never Smoker  . Smokeless tobacco: Former Neurosurgeon    Types: Snuff  Substance Use Topics  . Alcohol use: No    Frequency: Never  . Drug use: No    Review of Systems Level 5 caveat: Unable to obtain review of systems due to altered mental status    ____________________________________________   PHYSICAL EXAM:  VITAL SIGNS: ED Triage Vitals [01/17/18 1358]  Enc Vitals Group     BP 116/63     Pulse Rate 95     Resp 20     Temp      Temp src  SpO2 98 %     Weight      Height      Head Circumference      Peak Flow      Pain Score      Pain Loc      Pain Edu?      Excl. in GC?     Constitutional: Somewhat tired appearing, but responsive. Eyes: Conjunctivae are normal.  EOMI.  PERRLA. Head: Atraumatic. Nose: No congestion/rhinnorhea. Mouth/Throat: Mucous membranes are slightly dry.   Neck: Normal range of motion.  Cardiovascular: Normal rate, regular rhythm. Grossly normal heart sounds.  Good peripheral circulation. Respiratory: Normal respiratory effort.  No retractions.  Scattered wheezes. Gastrointestinal: Soft and nontender. No distention.  Genitourinary: No flank tenderness. Musculoskeletal: No lower extremity edema.  Extremities warm and well perfused.  Neurologic: Motor intact in all  extremities. Skin:  Skin is warm and dry. No rash noted. Psychiatric: Unable to assess. ____________________________________________   LABS (all labs ordered are listed, but only abnormal results are displayed)  Labs Reviewed  COMPREHENSIVE METABOLIC PANEL - Abnormal; Notable for the following components:      Result Value   Glucose, Bld 149 (*)    BUN 50 (*)    Creatinine, Ser 2.14 (*)    Calcium 8.8 (*)    Albumin 3.2 (*)    GFR calc non Af Amer 17 (*)    GFR calc Af Amer 19 (*)    All other components within normal limits  TROPONIN I - Abnormal; Notable for the following components:   Troponin I 0.07 (*)    All other components within normal limits  CBC WITH DIFFERENTIAL/PLATELET - Abnormal; Notable for the following components:   WBC 11.6 (*)    Hemoglobin 10.2 (*)    HCT 32.7 (*)    MCHC 31.4 (*)    RDW 15.2 (*)    Neutro Abs 9.9 (*)    Lymphs Abs 0.8 (*)    All other components within normal limits  BRAIN NATRIURETIC PEPTIDE - Abnormal; Notable for the following components:   B Natriuretic Peptide 239.0 (*)    All other components within normal limits  URINALYSIS, COMPLETE (UACMP) WITH MICROSCOPIC   ____________________________________________  EKG  ED ECG REPORT I, Dionne BucySebastian Kesley Gaffey, the attending physician, personally viewed and interpreted this ECG.  Date: 01/17/2018 EKG Time: 1419 Rate: 90 Rhythm: normal sinus rhythm QRS Axis: normal Intervals: Incomplete RBBB ST/T Wave abnormalities: Nonspecific ST abnormalities Narrative Interpretation: no evidence of acute ischemia  ____________________________________________  RADIOLOGY  CXR: No focal infiltrate  ____________________________________________   PROCEDURES  Procedure(s) performed: No  Procedures  Critical Care performed: No ____________________________________________   INITIAL IMPRESSION / ASSESSMENT AND PLAN / ED COURSE  Pertinent labs & imaging results that were available during  my care of the patient were reviewed by me and considered in my medical decision making (see chart for details).  84 year old female with PMH as noted above including a history of asthma presents with increased confusion, shortness of breath and cough over the last several days, with hypoxia today.  I reviewed the past medical records in Epic; the patient was admitted most recently earlier this year for altered mental status and AKI.  The exam is as described above.  The patient currently is not in respiratory distress and is not hypoxic when on 4 to 6 L of oxygen.  Differential includes asthma exacerbation, acute bronchitis, pneumonia, or less likely cardiac etiology.  Plan: Chest x-ray, basic and cardiac labs,  nebulizers and steroid and reassess.  ----------------------------------------- 4:18 PM on 01/17/2018 -----------------------------------------  Chest x-ray showed no focal infiltrate.  I suspect most likely acute bronchitis.  Given the patient's hypoxia at home, as well as her altered mental status and weakness, she will require admission.  I discussed the results of the work-up and the plan of care with the family members, and they agreed.  I signed the patient out to the hospitalist Dr. Elisabeth Pigeon. ____________________________________________   FINAL CLINICAL IMPRESSION(S) / ED DIAGNOSES  Final diagnoses:  Acute bronchitis, unspecified organism  Hypoxia      NEW MEDICATIONS STARTED DURING THIS VISIT:  New Prescriptions   No medications on file     Note:  This document was prepared using Dragon voice recognition software and may include unintentional dictation errors.    Dionne Bucy, MD 01/17/18 [phone]

## 2018-01-17 NOTE — Progress Notes (Signed)
Palliative Medicine Team  Due to high volume of referrals, there is a delay seeing this patient. [facility] the weekend but will arrange goals of care with patient and family on Monday. Thank you for the opportunity to participate in the care of Ms. Fulghum.  NO CHARGE  Vennie HomansMegan Aruna Nestler, FNP-C Palliative Medicine Team  Phone: [phone] Fax: [phone]

## 2018-01-18 LAB — GLUCOSE, CAPILLARY: Glucose-Capillary: 125 mg/dL — ABNORMAL HIGH (ref 70–99)

## 2018-01-18 LAB — BASIC METABOLIC PANEL
Anion gap: 8 (ref 5–15)
BUN: 48 mg/dL — ABNORMAL HIGH (ref 8–23)
CHLORIDE: 109 mmol/L (ref 98–111)
CO2: 27 mmol/L (ref 22–32)
Calcium: 8.3 mg/dL — ABNORMAL LOW (ref 8.9–10.3)
Creatinine, Ser: 2.05 mg/dL — ABNORMAL HIGH (ref 0.44–1.00)
GFR calc non Af Amer: 18 mL/min — ABNORMAL LOW (ref >60)
GFR, EST AFRICAN AMERICAN: 21 mL/min — AB (ref >60)
Glucose, Bld: 152 mg/dL — ABNORMAL HIGH (ref 70–99)
Potassium: 5.2 mmol/L — ABNORMAL HIGH (ref 3.5–5.1)
SODIUM: 144 mmol/L (ref 135–145)

## 2018-01-18 LAB — CBC
HEMATOCRIT: 29.1 % — AB (ref 35.0–47.0)
Hemoglobin: 9.3 g/dL — ABNORMAL LOW (ref 12.0–16.0)
MCH: 26.9 pg (ref 26.0–34.0)
MCHC: 31.9 g/dL — ABNORMAL LOW (ref 32.0–36.0)
MCV: 84.5 fL (ref 80.0–100.0)
Platelets: 172 10*3/uL (ref 150–440)
RBC: 3.45 MIL/uL — AB (ref 3.80–5.20)
RDW: 15.2 % — ABNORMAL HIGH (ref 11.5–14.5)
WBC: 7.6 10*3/uL (ref 3.6–11.0)

## 2018-01-18 NOTE — Progress Notes (Signed)
Pt calls frequently for nurse or family member. Easily calmed but then will call out again a few minutes later. Made comfortable in bed. Video monitor in room.

## 2018-01-18 NOTE — Evaluation (Signed)
Physical Therapy Evaluation Patient Details Name: Briana [name] MRN: [id number] DOB: [birth year] Today's Date: 01/18/2018   History of Present Illness  Briana [name]  is a 84 y.o. female with PMH as noted below who presents with altered mental status (more sleepy and reduced p.o. Intake) gradually over the last several days, associated with cough and increased shortness of breath.  The family noted that the patient was hypoxic to 79% today although she may have been off her oxygen at that time.  The patient is normally on 2 L at home.  While in the ED talking to patient's granddaughter she is worried about her mother who is spending a lot of time with her grandmother Briana [name] and feels like she will need to be placed to a facility  Clinical Impression  Patient is a 84 y/o female that has been previously living at home with her daughter, who has been her primary caretaker. Per daughter, patient has become increasingly more difficulty to transfer recently, which has largely been her baseline (transfers only). She has been able to stand with min A from family and pivot to w/c or BSC primarily. Today patient has minimal LE control and has her feet slide in standing due to posterior lean. She requires significant assistance for sit to stand and bed to chair transfers secondary to LE weakness, and has certainly regressed from her baseline. Patient would benefit from SNF placement at discharge to improve her functional mobility.     Follow Up Recommendations SNF    Equipment Recommendations       Recommendations for Other Services       Precautions / Restrictions Precautions Precautions: Fall Restrictions Weight Bearing Restrictions: No      Mobility  Bed Mobility Overal bed mobility: Needs Assistance Bed Mobility: Supine to Sit     Supine to sit: Min assist;Mod assist     General bed mobility comments: Patient is able to complete transfer with assistance for trunk control.    Transfers Overall transfer level: Needs assistance   Transfers: Stand Pivot Transfers   Stand pivot transfers: Max assist       General transfer comment: attempted 2WW, pt more proficient with stand pivot t/f at max A, patient able to shuffle feet   Ambulation/Gait                Stairs            Wheelchair Mobility    Modified Rankin (Stroke Patients Only)       Balance Overall balance assessment: Needs assistance Sitting-balance support: Bilateral upper extremity supported Sitting balance-Leahy Scale: Fair     Standing balance support: Bilateral upper extremity supported Standing balance-Leahy Scale: Poor                               Pertinent Vitals/Pain Pain Assessment: No/denies pain Faces Pain Scale: No hurt    Home Living Family/patient expects to be discharged to:: Private residence Living Arrangements: Children;Other relatives(daughter) Available Help at Discharge: Family Type of Home: House Home Access: Stairs to enter   [employer] of Steps: 1 Home Layout: One level Home Equipment: Walker - 2 wheels;Bedside commode Additional Comments: pt does not use shower or engage in much functional activity independently at baseline    Prior Function Level of Independence: Needs assistance   Gait / Transfers Assistance Needed: Pt required SBA to CGA to min assist with bed mobility, transfers, and  ambulation (using RW) within the home.    ADL's / Homemaking Assistance Needed: Pt reports she was needing to help her mother do all BADL and home tasks  Comments: Has caregivers during day and pt's son stays with pt at night; pt's daughter comes every other week to help.  Has 24/7 care.     Hand Dominance        Extremity/Trunk Assessment   Upper Extremity Assessment Upper Extremity Assessment: Generalized weakness    Lower Extremity Assessment Lower Extremity Assessment: Generalized weakness       Communication    Communication: HOH(unable to hear therapist at all during session)  Cognition Arousal/Alertness: Awake/alert Behavior During Therapy: WFL for tasks assessed/performed Overall Cognitive Status: Within Functional Limits for tasks assessed                                 General Comments: pt frequently shouting "I don't wanna do that I am fine here" but compliant once therapist initated movement      General Comments      Exercises     Assessment/Plan    PT Assessment Patient needs continued PT services  PT Problem List Decreased strength;Decreased mobility;Decreased safety awareness;Cardiopulmonary status limiting activity;Decreased cognition;Decreased balance;Decreased knowledge of use of DME       PT Treatment Interventions Gait training;Therapeutic exercise;Therapeutic activities;DME instruction;Balance training;Neuromuscular re-education    PT Goals (Current goals can be found in the Care Plan section)  Acute Rehab PT Goals Patient Stated Goal: to return to PLOF PT Goal Formulation: With family Time For Goal Achievement: 02/01/18 Potential to Achieve Goals: Good    Frequency Min 2X/week   Barriers to discharge Decreased caregiver support      Co-evaluation   Reason for Co-Treatment: For patient/therapist safety;To address functional/ADL transfers   OT goals addressed during session: ADL's and self-care       AM-PAC PT "6 Clicks" Daily Activity  Outcome Measure Difficulty turning over in bed (including adjusting bedclothes, sheets and blankets)?: A Lot Difficulty moving from lying on back to sitting on the side of the bed? : A Lot Difficulty sitting down on and standing up from a chair with arms (e.g., wheelchair, bedside commode, etc,.)?: A Lot Help needed moving to and from a bed to chair (including a wheelchair)?: A Lot Help needed walking in hospital room?: Total Help needed climbing 3-5 steps with a railing? : Total 6 Click Score: 10     End of Session Equipment Utilized During Treatment: Gait belt;Oxygen Activity Tolerance: Patient tolerated treatment well;Patient limited by fatigue Patient left: in chair;with family/visitor present;with call bell/phone within reach;with chair alarm set Nurse Communication: Mobility status PT Visit Diagnosis: Muscle weakness (generalized) (M62.81);Difficulty in walking, not elsewhere classified (R26.2)    Time: [id number] PT Time Calculation (min) (ACUTE ONLY): 16 min   Charges:   PT Evaluation $PT Eval Moderate Complexity: 1 Mod     PT G Codes:       Alva GarnetPatrick McNamara PT, DPT, CSCS    01/18/2018, 4:54 PM

## 2018-01-18 NOTE — Progress Notes (Signed)
[facility] [place] at [facility]   PATIENT NAME: Briana [name]    MR#:  [id number]  DATE OF BIRTH:  19-Jan-1908  SUBJECTIVE:  very sleepy. Poor historian. No issues per RN  REVIEW OF SYSTEMS:   Review of Systems  Unable to perform ROS: Mental status change     DRUG ALLERGIES:   Allergies  Allergen Reactions  . Amoxicillin Diarrhea    Has patient had a PCN reaction causing immediate rash, facial/tongue/throat swelling, SOB or lightheadedness with hypotension: No Has patient had a PCN reaction causing severe rash involving mucus membranes or skin necrosis: No Has patient had a PCN reaction that required hospitalization: No Has patient had a PCN reaction occurring within the last 10 years: No If all of the above answers are "NO", then may proceed with Cephalosporin use.    VITALS:  Blood pressure (!) 113/58, pulse (!) 59, temperature 98 F (36.7 C), temperature source Oral, resp. rate 18, weight 59.4 kg (131 lb), SpO2 100 %.  PHYSICAL EXAMINATION:   Physical Exam  GENERAL:  [age] y.o.-year-old patient lying in the bed with no acute distress. Thin cachectic EYES: Pupils equal, round, reactive to light and accommodation. No scleral icterus. Extraocular muscles intact.  HEENT: Head atraumatic, normocephalic. Oropharynx and nasopharynx clear.  NECK:  Supple, no jugular venous distention. No thyroid enlargement, no tenderness.  LUNGS: Normal breath sounds bilaterally, no wheezing, rales, rhonchi. No use of accessory muscles of respiration.  CARDIOVASCULAR: S1, S2 normal. No murmurs, rubs, or gallops.  ABDOMEN: Soft, nontender, nondistended. Bowel sounds present. No organomegaly or mass.  EXTREMITIES: No cyanosis, clubbing or edema b/l.    NEUROLOGIC: unable to assess very sleepy   PSYCHIATRICsleepy SKIN: No obvious rash, lesion, or ulcer.   LABORATORY PANEL:  CBC Recent Labs  Lab 01/18/18 0618  WBC 7.6  HGB 9.3*  HCT 29.1*  PLT 172     Chemistries  Recent Labs  Lab 01/17/18 1425 01/18/18 0618  NA 145 144  K 4.8 5.2*  CL 109 109  CO2 28 27  GLUCOSE 149* 152*  BUN 50* 48*  CREATININE 2.14* 2.05*  CALCIUM 8.8* 8.3*  AST 26  --   ALT 10  --   ALKPHOS 58  --   BILITOT 0.7  --    Cardiac Enzymes Recent Labs  Lab 01/17/18 1425  TROPONINI 0.07*   RADIOLOGY:  Dg Chest Portable 1 View  Result Date: 01/17/2018 CLINICAL DATA:  Cough for several days. EXAM: PORTABLE CHEST 1 VIEW COMPARISON:  Numerous chest x-rays since 2012 FINDINGS: Density to the right of the trachea/mediastinum was noted to represent prominent vasculature on a CT scan from 2008. The cardiomediastinal silhouette is unchanged. Reticular opacities in the bases are consistent with the patient's known interstitial lung disease. No new infiltrate identified. No acute interval change. IMPRESSION: No acute interval changes. Electronically Signed   By: Gerome Samavid  Williams III M.D   On: 01/17/2018 14:46   ASSESSMENT AND PLAN:   84 year old female being admitted for possible asthma exacerbation/acute bronchitis  * Asthma exacerbation/acute bronchitis -Start DuoNeb breathing treatment -Avoid the Solu-Medrol and continue her home dose of prednisone 5 g daily  * Acute on Chronic kidney disease stage IV -Likely due to poor p.o. intake cannot rule out ATN or postobstructive -Start IV hydration, avoid nephrotoxic medications and monitor kidney function  *Elevated troponin -Likely due to demand ischemia, will get serial troponin, monitor on off unit telemetry  *Weakness -We will get a physical  therapy and Occupational Therapy consultation  No family in the room Case discussed with Care Management/Social Worker. Management plans discussed with the patient, family and they are in agreement.  CODE STATUS:DNR DVT Prophylaxis: lovenox  TOTAL TIME TAKING CARE OF THIS PATIENT: *25* minutes.  >50% time spent on counselling and coordination of  care  POSSIBLE D/C IN *few* DAYS, DEPENDING ON CLINICAL CONDITION.  Note: This dictation was prepared with Dragon dictation along with smaller phrase technology. Any transcriptional errors that result from this process are unintentional.  Enedina Finner M.D on 01/18/2018 at 12:22 PM  Between 7am to 6pm - Pager - [phone]  After 6pm go to [email or web address] - Social research officer, government  [facility] [place] [facility]  Office  [phone]  CC: Primary care physician; Patrice Paradise, MDPatient ID: Briana [name], female   DOB: Jul 05, 1908, 84 y.o.   MRN: [id number]

## 2018-01-18 NOTE — Evaluation (Signed)
Occupational Therapy Evaluation Patient Details Name: Briana [name] MRN: [id number] DOB: 05-20-1908 Today's Date: 01/18/2018    History of Present Illness Briana [name]  is a 84 y.o. female with PMH as noted below who presents with altered mental status (more sleepy and reduced p.o. Intake) gradually over the last several days, associated with cough and increased shortness of breath.  The family noted that the patient was hypoxic to 79% today although she may have been off her oxygen at that time.  The patient is normally on 2 L at home.  While in the ED talking to patient's granddaughter she is worried about her mother who is spending a lot of time with her grandmother Ms. Moquin and feels like she will need to be placed to a facility   Clinical Impression   Met pt lying supine in bed, daughter at bedside this date. Pt extremely HOH, unable to hear therapists during session at all. Frequently looking to daughter saying "what are they here for or what are they doing". Daughter told therapists to help her start to move and she will comply. Pt daughter reports they use a whiteboard at home to communicate. OT attempted with pen and paper, pt stating "I can't read this". Tactile cues and gestures used to initiate movement, pt would comply despite saying "I'm fine like this". Daughter reports caring for mother has become increasingly difficult in the home and she has had marked difficulty helping pt from chair to [place] using 2WW. Supine <> sit completed with min A and tactile cues to guide pt EOB. Attempted ROM assessment pt stating "I don't want any of that". Sit <> stand attempted with 2WW, pt needing mod A x2. Pt unable to follow tactile cues of aligning pelvis underneath body with feet slipping. Changed to max A x1 for stand pivot to chair. Pt brief soiled (noticed on way to chair), removed and performed peri care with total A. Mod-max X2 needed to maintain standing to complete brief change.   Pt will benefit  from SNF level of care to allow pt to return to functional baseline and maintain safety in the home, per daughter is only available caregiver this date and she feels she is not equipped to manage the level of care this date.    Follow Up Recommendations  SNF;Supervision/Assistance - 24 hour    Equipment Recommendations       Recommendations for Other Services       Precautions / Restrictions        Mobility Bed Mobility Overal bed mobility: Needs Assistance Bed Mobility: Supine to Sit     Supine to sit: Min assist     General bed mobility comments: pt needing tactile cues to start moving legs toward EOB, and HHA to pull self to sitting  Transfers Overall transfer level: Needs assistance   Transfers: Stand Pivot Transfers   Stand pivot transfers: Max assist       General transfer comment: attempted 2WW, pt more proficient with stand pivot t/f at max A    Balance Overall balance assessment: Needs assistance Sitting-balance support: Bilateral upper extremity supported Sitting balance-Leahy Scale: Fair     Standing balance support: Bilateral upper extremity supported Standing balance-Leahy Scale: Poor                             ADL either performed or assessed with clinical judgement   ADL Overall ADL's : Needs assistance/impaired Eating/Feeding: Set  up   Grooming: Minimal assistance   Upper Body Bathing: Total assistance;With caregiver independent assisting Upper Body Bathing Details (indicate cue type and reason): recieves bed level bath from daughter with total A at baseline Lower Body Bathing: Total assistance;With caregiver independent assisting Lower Body Bathing Details (indicate cue type and reason): recieves bed level bath from daughter with total A at baseline Upper Body Dressing : With caregiver independent assisting   Lower Body Dressing: With caregiver independent assisting   Toilet Transfer: RW;Stand-pivot;Maximal assistance Toilet  Transfer Details (indicate cue type and reason): incontinent- uses brief   Toileting - Clothing Manipulation Details (indicate cue type and reason): incontinent- uses brief   Tub/Shower Transfer Details (indicate cue type and reason): pt does not use at baseline   General ADL Comments: daughter states pt can pull of brief, so she allows her to do so, but will also take it off at inappropriate times     Vision Patient Visual Report: No change from baseline       Perception     Praxis      Pertinent Vitals/Pain Pain Assessment: Faces Faces Pain Scale: No hurt     Hand Dominance     Extremity/Trunk Assessment Upper Extremity Assessment Upper Extremity Assessment: Generalized weakness   Lower Extremity Assessment Lower Extremity Assessment: Generalized weakness       Communication Communication Communication: HOH(unable to hear therapist at all during session)   Cognition Arousal/Alertness: Awake/alert Behavior During Therapy: WFL for tasks assessed/performed Overall Cognitive Status: Within Functional Limits for tasks assessed                                 General Comments: pt frequently shouting "I don't wanna do that I am fine here" but compliant once therapist initated movement   General Comments       Exercises     Shoulder Instructions      Home Living Family/patient expects to be discharged to:: Private residence Living Arrangements: Children;Other relatives(daughter) Available Help at Discharge: Family Type of Home: House Home Access: Stairs to enter [employer] of Steps: 1   Home Layout: One level               Home Equipment: [place] - 2 wheels;Bedside commode   Additional Comments: pt does not use shower or engage in much functional activity independently at baseline      Prior Functioning/Environment Level of Independence: Needs assistance    ADL's / Homemaking Assistance Needed: Pt reports she was needing to  help her mother do all BADL and home tasks            OT Problem List: Decreased strength;Impaired balance (sitting and/or standing);Decreased activity tolerance      OT Treatment/Interventions: Self-care/ADL training;Energy conservation;Patient/family education    OT Goals(Current goals can be found in the care plan section) Acute Rehab OT Goals Patient Stated Goal: to return to PLOF OT Goal Formulation: With patient/family Time For Goal Achievement: 02/01/18 Potential to Achieve Goals: Good  OT Frequency: Min 2X/week   Barriers to D/C:    pt daughter reports caring for mother is becoming too difficult, states "she is much to heavy for me now"       Co-evaluation PT/OT/SLP Co-Evaluation/Treatment: Yes Reason for Co-Treatment: For patient/therapist safety;To address functional/ADL transfers   OT goals addressed during session: ADL's and self-care      AM-PAC PT "6 Clicks" Daily Activity  Outcome Measure Help from another person eating meals?: A Little Help from another person taking care of personal grooming?: A Little Help from another person toileting, which includes using toliet, bedpan, or urinal?: Total Help from another person bathing (including washing, rinsing, drying)?: Total Help from another person to put on and taking off regular upper body clothing?: A Lot Help from another person to put on and taking off regular lower body clothing?: A Lot 6 Click Score: 12   End of Session Equipment Utilized During Treatment: Gait belt;Oxygen Nurse Communication: Mobility status  Activity Tolerance: Patient tolerated treatment well Patient left: in chair;with call bell/phone within reach;with family/visitor present  OT Visit Diagnosis: Muscle weakness (generalized) (M62.81);Other abnormalities of gait and mobility (R26.89)                Time: 1520-1530 OT Time Calculation (min): 10 min Charges:  OT General Charges $OT Visit: 1 Visit OT Evaluation $OT Eval Low  Complexity: 1 Low G-Codes:     Zenovia Jarred, [place], OTR/L [phone]  Zenovia Jarred 01/18/2018, 4:12 PM

## 2018-01-18 NOTE — Progress Notes (Signed)
Pt agitated and calling out loudly, trying to pull piv out. Mitts applied.

## 2018-01-19 LAB — GLUCOSE, CAPILLARY: Glucose-Capillary: 87 mg/dL (ref 70–99)

## 2018-01-19 MED ORDER — IPRATROPIUM-ALBUTEROL 0.5-2.5 (3) MG/3ML IN SOLN: 3.0000 mL | Freq: Four times a day (QID) | RESPIRATORY_TRACT | Status: DC | PRN

## 2018-01-19 MED ORDER — IPRATROPIUM-ALBUTEROL 0.5-2.5 (3) MG/3ML IN SOLN
3.0000 mL | Freq: Two times a day (BID) | RESPIRATORY_TRACT | Status: DC
  Administered 2018-01-19 – 2018-01-20 (×2): 3 mL via RESPIRATORY_TRACT
  Filled 2018-01-19 (×2): qty 3

## 2018-01-19 MED ORDER — IPRATROPIUM-ALBUTEROL 0.5-2.5 (3) MG/3ML IN SOLN
3.0000 mL | Freq: Three times a day (TID) | RESPIRATORY_TRACT | Status: DC
  Administered 2018-01-19: 3 mL via RESPIRATORY_TRACT
  Filled 2018-01-19: qty 3

## 2018-01-19 NOTE — Progress Notes (Signed)
[facility] -  at [facility]   PATIENT NAME: Briana [name]    MR#:  [id number]  DATE OF BIRTH:  10-17-1907  SUBJECTIVE:  very sleepy. Poor historian. No issues per RN  REVIEW OF SYSTEMS:   Review of Systems  Unable to perform ROS: Mental status change     DRUG ALLERGIES:   Allergies  Allergen Reactions  . Amoxicillin Diarrhea    Has patient had a PCN reaction causing immediate rash, facial/tongue/throat swelling, SOB or lightheadedness with hypotension: No Has patient had a PCN reaction causing severe rash involving mucus membranes or skin necrosis: No Has patient had a PCN reaction that required hospitalization: No Has patient had a PCN reaction occurring within the last 10 years: No If all of the above answers are "NO", then may proceed with Cephalosporin use.    VITALS:  Blood pressure 138/69, pulse 88, temperature 98 F (36.7 C), temperature source Axillary, resp. rate 20, weight 60.3 kg (133 lb), SpO2 (!) 88 %.  PHYSICAL EXAMINATION:   Physical Exam  GENERAL:  84 y.o.-year-old patient lying in the bed with no acute distress. Thin cachectic EYES: Pupils equal, round, reactive to light and accommodation. No scleral icterus. Extraocular muscles intact.  HEENT: Head atraumatic, normocephalic. Oropharynx and nasopharynx clear.  NECK:  Supple, no jugular venous distention. No thyroid enlargement, no tenderness.  LUNGS: Normal breath sounds bilaterally, no wheezing, rales, rhonchi. No use of accessory muscles of respiration.  CARDIOVASCULAR: S1, S2 normal. No murmurs, rubs, or gallops.  ABDOMEN: Soft, nontender, nondistended. Bowel sounds present. No organomegaly or mass.  EXTREMITIES: No cyanosis, clubbing or edema b/l.    NEUROLOGIC: unable to assess very sleepy   PSYCHIATRICsleepy SKIN: No obvious rash, lesion, or ulcer.   LABORATORY PANEL:  CBC Recent Labs  Lab 01/18/18 0618  WBC 7.6  HGB 9.3*  HCT 29.1*  PLT 172     Chemistries  Recent Labs  Lab 01/17/18 1425 01/18/18 0618  NA 145 144  K 4.8 5.2*  CL 109 109  CO2 28 27  GLUCOSE 149* 152*  BUN 50* 48*  CREATININE 2.14* 2.05*  CALCIUM 8.8* 8.3*  AST 26  --   ALT 10  --   ALKPHOS 58  --   BILITOT 0.7  --    Cardiac Enzymes Recent Labs  Lab 01/17/18 1425  TROPONINI 0.07*   RADIOLOGY:  Dg Chest Portable 1 View  Result Date: 01/17/2018 CLINICAL DATA:  Cough for several days. EXAM: PORTABLE CHEST 1 VIEW COMPARISON:  Numerous chest x-rays since 2012 FINDINGS: Density to the right of the trachea/mediastinum was noted to represent prominent vasculature on a CT scan from 2008. The cardiomediastinal silhouette is unchanged. Reticular opacities in the bases are consistent with the patient's known interstitial lung disease. No new infiltrate identified. No acute interval change. IMPRESSION: No acute interval changes. Electronically Signed   By: Gerome Sam III M.D   On: 01/17/2018 14:46   ASSESSMENT AND PLAN:   84 year old female being admitted for possible asthma exacerbation/acute bronchitis  * Asthma exacerbation/acute bronchitis -Start DuoNeb breathing treatment -Avoid the Solu-Medrol and continue her home dose of prednisone 5 g daily sats >92% on 2 liter [place]--uses oxygen at home  * Acute on Chronic kidney disease stage IV -Likely due to poor p.o. intake cannot rule out ATN or postobstructive -Start IV hydration, avoid nephrotoxic medications and monitor kidney function  *Elevated troponin -Likely due to demand ischemia, will get serial troponin, monitor on off unit  telemetry  *Weakness -We will get a physical therapy and Occupational Therapy consultation Pt has been very deconditioned at hme. Her 82 year old dter is not able to manage her at home and does not have much help. Has financial constriants for private help. She did well after rehab in feb 2019 per dter and niece. Will d/c to rehab ---family feels they will not be  [facility] with home health that was offered   No family in the room Case discussed with Care Management/Social Worker. Management plans discussed with the patient, family and they are in agreement.  CODE STATUS:DNR DVT Prophylaxis: lovenox  TOTAL TIME TAKING CARE OF THIS PATIENT: *25* minutes.  >50% time spent on counselling and coordination of care  POSSIBLE D/C IN *few* DAYS, DEPENDING ON CLINICAL CONDITION.  Note: This dictation was prepared with Dragon dictation along with smaller phrase technology. Any transcriptional errors that result from this process are unintentional.  Enedina FinnerSona Ankur Snowdon M.D on 01/19/2018 at 1:32 PM  Between 7am to 6pm - Pager - [phone]  After 6pm go to [email or web address] - Social research officer, governmentpassword EPAS [facility]  [facility] [place] [facility]  Office  [phone]  CC: Primary care physician; Patrice ParadiseMcLaughlin, Miriam K, MDPatient ID: Newt LukesAlene E Addair, female   DOB: June 09, 1908, [age] y.o.   MRN: [id number]

## 2018-01-19 NOTE — [place] FL2 (Signed)
[place] MEDICAID FL2 LEVEL OF CARE SCREENING TOOL     IDENTIFICATION  Patient Name: Briana [name] Birthdate: [birth year] Sex: female Admission Date (Current Location): 01/17/2018  [place] and [place] Number:  Chiropodist and Address:  [facility], [address], [place], [place] [address]      Provider Number: [id number]  Attending Physician Name and Address:  Enedina Finner, MD  Relative Name and Phone Number:  Frann Rider (Granddaughter) [phone]; Linward Foster (Daughter) [phone]; Ameisha Mcclellan (Granddaughter) [phone]    Current Level of Care: Hospital Recommended Level of Care: Skilled Nursing Facility Prior Approval Number:    Date Approved/Denied:   PASRR Number: [id number]  Discharge Plan: SNF    Current Diagnoses: Patient Active Problem List   Diagnosis Date Noted  . Asthma exacerbation 01/17/2018  . Hospice care 08/14/2017  . Altered mental status 08/13/2017  . GIB (gastrointestinal bleeding) 12/01/2014    Orientation RESPIRATION BLADDER Height & Weight     Self, Time, Situation, Place  O2(2L o2) Incontinent Weight: 133 lb (60.3 kg) Height:     BEHAVIORAL SYMPTOMS/MOOD NEUROLOGICAL BOWEL NUTRITION STATUS      Continent    AMBULATORY STATUS COMMUNICATION OF NEEDS Skin   Extensive Assist Verbally Normal                       Personal Care Assistance Level of Assistance  Bathing, Feeding, Dressing Bathing Assistance: Limited assistance Feeding assistance: Independent Dressing Assistance: Limited assistance     Functional Limitations Info  Sight, Hearing, Speech Sight Info: Adequate Hearing Info: Adequate Speech Info: Adequate    SPECIAL CARE FACTORS FREQUENCY  PT (By licensed PT), OT (By licensed OT)     PT Frequency: Up to 5X per week OT Frequency: Up to 3X per week            Contractures Contractures Info: Not present    Additional Factors Info  Code Status, Allergies Code Status  Info: DNR Allergies Info: Amoxicillin           Current Medications (01/19/2018):  This is the current hospital active medication list Current Facility-Administered Medications  Medication Dose Route Frequency Provider Last Rate Last Dose  . acetaminophen (TYLENOL) tablet 650 mg  650 mg Oral Q6H PRN Delfino Lovett, MD   650 mg at 01/18/18 2120   Or  . acetaminophen (TYLENOL) suppository 650 mg  650 mg Rectal Q6H PRN Delfino Lovett, MD      . aspirin EC tablet 81 mg  81 mg Oral Daily Sherryll Burger, Vipul, MD   81 mg at 01/19/18 1041  . bisacodyl (DULCOLAX) EC tablet 5 mg  5 mg Oral Daily PRN Delfino Lovett, MD      . cholecalciferol (VITAMIN D) tablet 1,000 Units  1,000 Units Oral Daily Delfino Lovett, MD   1,000 Units at 01/19/18 1040  . docusate sodium (COLACE) capsule 100 mg  100 mg Oral BID Delfino Lovett, MD   100 mg at 01/18/18 2119  . ferrous sulfate tablet 325 mg  325 mg Oral BID WC Delfino Lovett, MD   325 mg at 01/19/18 1041  . furosemide (LASIX) tablet 20 mg  20 mg Oral Daily Delfino Lovett, MD   20 mg at 01/19/18 1040  . heparin injection 5,000 Units  5,000 Units Subcutaneous Q8H Delfino Lovett, MD   5,000 Units at 01/19/18 1449  . HYDROcodone-acetaminophen (NORCO/VICODIN) 5-325 MG per tablet 1-2 tablet  1-2 tablet Oral Q4H PRN Sherryll Burger,  Vipul, MD      . ipratropium-albuterol (DUONEB) 0.5-2.5 (3) MG/3ML nebulizer solution 3 mL  3 mL Nebulization Q6H PRN Enedina FinnerPatel, Sona, MD      . ipratropium-albuterol (DUONEB) 0.5-2.5 (3) MG/3ML nebulizer solution 3 mL  3 mL Nebulization BID Enedina FinnerPatel, Sona, MD      . MEDLINE mouth rinse  15 mL Mouth Rinse BID Delfino LovettShah, Vipul, MD   15 mL at 01/19/18 1042  . ondansetron (ZOFRAN) tablet 4 mg  4 mg Oral Q6H PRN Delfino LovettShah, Vipul, MD       Or  . ondansetron (ZOFRAN) injection 4 mg  4 mg Intravenous Q6H PRN Delfino LovettShah, Vipul, MD      . pantoprazole (PROTONIX) EC tablet 40 mg  40 mg Oral Daily Delfino LovettShah, Vipul, MD   40 mg at 01/19/18 1042  . predniSONE (DELTASONE) tablet 5 mg  5 mg Oral Daily Delfino LovettShah, Vipul, MD   5 mg  at 01/19/18 1040  . risperiDONE (RISPERDAL) tablet 0.25 mg  0.25 mg Oral TID Delfino LovettShah, Vipul, MD   0.25 mg at 01/19/18 1042  . traZODone (DESYREL) tablet 25 mg  25 mg Oral QHS PRN Delfino LovettShah, Vipul, MD   25 mg at 01/18/18 2119     Discharge Medications: Please see discharge summary for a list of discharge medications.  Relevant Imaging Results:  Relevant Lab Results:   Additional Information SS#[id number]  Judi CongKaren M Jakeya Gherardi, LCSW

## 2018-01-19 NOTE — Progress Notes (Signed)
Currently sleeping without distress. resp easy, o2 on. Video monitor in use.

## 2018-01-19 NOTE — Clinical Social Work Note (Signed)
Clinical Social Work Assessment  Patient Details  Name: Briana [name] MRN: [id number] Date of Birth: [birth year]  Date of referral:  01/19/18               Reason for consult:  Facility Placement                Permission sought to share information with:  Chartered certified accountant granted to share information::  Yes, Verbal Permission Granted  Name::        Agency::  [facility]  Relationship::     Contact Information:     Housing/Transportation Living arrangements for the past 2 months:  [place] of Information:  Medical Team, Other (Comment Required)(Adult Grandchild) Patient Interpreter Needed:  None Criminal Activity/Legal Involvement Pertinent to Current Situation/Hospitalization:  No - Comment as needed Significant Relationships:  Adult Children, Warehouse manager, [place], Other Family Members, Friend Lives with:  Relatives Do you feel safe going back to the place where you live?  Yes Need for family participation in patient care:  Yes (Comment)  Care giving concerns:  PT recommendation for SNF   Social Worker assessment / plan:  The CSW met with the patient and her granddaughter, Edd Fabian, at bedside to discuss discharge planning. Sabre gave verbal permission to begin the referral process, and she named [employer] as the preference. The patient lives with family at baseline, and the plan is for her to return home when more able to ambulate safely.   The CSW has sent the referral. The patient will most likely discharge tomorrow pending bed availability and [facility] authorization. CSW will facilitate discharge when appropriate.  Employment status:  Retired Nurse, adult PT Recommendations:  [place] / Referral to community resources:  [place]  Patient/Family's Response to care: The patient's family thanked the CSW.  Patient/Family's Understanding of and Emotional  Response to Diagnosis, Current Treatment, and Prognosis:  The patient's family are in agreement with short term rehab and having the patient remain in her own home as long as possible.  Emotional Assessment Appearance:  Appears younger than stated age Attitude/Demeanor/Rapport:  Lethargic Affect (typically observed):  Stable Orientation:  Oriented to Self, Oriented to Situation, Oriented to Place Alcohol / Substance use:  Never Used Psych involvement (Current and /or in the community):  No (Comment)  Discharge Needs  Concerns to be addressed:  Care Coordination, Discharge Planning Concerns Readmission within the last 30 days:  No Current discharge risk:  Chronically ill Barriers to Discharge:  Continued Medical Work up   [employer], LCSW 01/19/2018, 3:12 PM

## 2018-01-19 NOTE — Progress Notes (Signed)
Pt yelling out and picking at covers and piv. Pt was able to remove kerlix wrap from around iv and remove piv. Dressing applied.

## 2018-01-19 NOTE — Progress Notes (Signed)
Pt able to pull mitts off. This nurse sat at bedside x15 min for direct supervision and diversion [facility]. Pt did eventually fall asleep. Video monitor in room.

## 2018-01-20 LAB — GLUCOSE, CAPILLARY: Glucose-Capillary: 83 mg/dL (ref 70–99)

## 2018-01-20 NOTE — Clinical Social Work Placement (Signed)
   CLINICAL SOCIAL WORK PLACEMENT  NOTE  Date:  01/20/2018  Patient Details  Name: Briana [name] MRN: [id number] Date of Birth: 04/29/1908  Clinical Social Work is seeking post-discharge placement for this patient at the Skilled  Nursing Facility level of care (*CSW will initial, date and re-position this form in  chart as items are completed):  Yes   Patient/family provided with [place] Clinical Social Work Department's list of facilities offering this level of care within the geographic area requested by the patient (or if unable, by the patient's family).  Yes   Patient/family informed of their freedom to choose among providers that offer the needed level of care, that participate in Medicare, Medicaid or managed care program needed by the patient, have an available bed and are willing to accept the patient.  Yes   Patient/family informed of [facility]'s ownership interest in [facility] and [facility], as well as of the fact that they are under no obligation to receive care at these facilities.  PASRR submitted to EDS on       PASRR number received on       Existing PASRR number confirmed on 01/19/18     FL2 transmitted to all facilities in geographic area requested by pt/family on 01/19/18     FL2 transmitted to all facilities within larger geographic area on       Patient informed that his/her managed care company has contracts with or will negotiate with certain facilities, including the following:        Yes   Patient/family informed of bed offers received.  Patient chooses bed at [facility] )     Physician recommends and patient chooses bed at      Patient to be transferred to [facility] ) on 01/20/18.  Patient to be transferred to facility by [facility] )     Patient family notified on 01/20/18 of transfer.  Name of family member notified:  (Patient's granddaughters Waldron SessionVonda and Georjean ModeSabre are aware of D/C today. )     PHYSICIAN       Additional Comment:    _______________________________________________ Melady Chow, Darleen CrockerBailey M, LCSW 01/20/2018, 12:45 PM

## 2018-01-20 NOTE — Progress Notes (Signed)
EMS here to transport pt. D/C on 2L oxygen.

## 2018-01-20 NOTE — Care Management Important Message (Signed)
Important Message  Patient Details  Name: Briana [name] MRN: [id number] Date of Birth: 1907/12/05   Medicare Important Message Given:  Yes    Olegario MessierKathy A Charlesia Canaday 01/20/2018, 10:26 AM

## 2018-01-20 NOTE — Discharge Summary (Signed)
[facility] [place] at [facility]   PATIENT NAME: Briana [name]    MR#:  [id number]  DATE OF BIRTH:  02/25/1908  DATE OF ADMISSION:  01/17/2018 ADMITTING PHYSICIAN: Delfino Lovett, MD  DATE OF DISCHARGE: 01/20/2018  PRIMARY CARE PHYSICIAN: Patrice Paradise, MD    ADMISSION DIAGNOSIS:  Hypoxia [R09.02] Acute bronchitis, unspecified organism [J20.9]  DISCHARGE DIAGNOSIS:  Asthma exacerbation Generalized deconditioning  SECONDARY DIAGNOSIS:   Past Medical History:  Diagnosis Date  . Allergy   . Anemia, unspecified   . Angina pectoris (HCC)   . Arthritis   . Asthma exacerbation 01/17/2018  . Asthma without status asthmaticus    unspecified  . Atherosclerotic heart disease   . Chronic kidney disease   . Diverticulosis   . HH (hiatus hernia)    colonoscopy 03/30/1999  . Hyperlipidemia, unspecified   . Hypertension   . On prednisone therapy   . Osteoarthritis   . Osteoporosis   . Peripheral neuropathy   . Pneumonia   . Pulmonary fibrosis (HCC)   . Temporal arteritis (HCC) 10/2013    HOSPITAL COURSE:   84 year old female being admitted for possible asthma exacerbation/acute bronchitis  * Asthma exacerbation/acute bronchitis -Start DuoNeb breathing treatment -received one dose of IV Solu-Medrol and now continue her home dose of prednisone 5 g daily (for temporal arteritis) sats >92% on 2 liter [place]--uses oxygen at home  * Acute onChronic kidney disease stage IV -Likely due to poor p.o. intake cannot rule out ATN or postobstructive -recieved IV hydration,avoid nephrotoxic medications and monitor kidney function -creatinin improving slowly  *Elevated troponin -Likely due to demand ischemia  *Weakness -We will get a physical therapy and Occupational Therapy consultation Pt has been very deconditioned at hme. Her 84 year old dter is not able to manage her at home and does not have much help. Has financial constriants for private help. She  did well after rehab in feb 2019 per dter and niece. Will d/c to rehab ---family feels they will not be able to manage her at home with home health that is offered   To [facility] pending insurance authorization  CONSULTS OBTAINED:    DRUG ALLERGIES:   Allergies  Allergen Reactions  . Amoxicillin Diarrhea    Has patient had a PCN reaction causing immediate rash, facial/tongue/throat swelling, SOB or lightheadedness with hypotension: No Has patient had a PCN reaction causing severe rash involving mucus membranes or skin necrosis: No Has patient had a PCN reaction that required hospitalization: No Has patient had a PCN reaction occurring within the last 10 years: No If all of the above answers are "NO", then may proceed with Cephalosporin use.    DISCHARGE MEDICATIONS:   Allergies as of 01/20/2018      Reactions   Amoxicillin Diarrhea   Has patient had a PCN reaction causing immediate rash, facial/tongue/throat swelling, SOB or lightheadedness with hypotension: No Has patient had a PCN reaction causing severe rash involving mucus membranes or skin necrosis: No Has patient had a PCN reaction that required hospitalization: No Has patient had a PCN reaction occurring within the last 10 years: No If all of the above answers are "NO", then may proceed with Cephalosporin use.      Medication List    TAKE these medications   acetaminophen 500 MG tablet Commonly known as:  TYLENOL Take 500-1,000 mg by mouth every 6 (six) hours as needed for mild pain or headache.   aspirin EC 81 MG tablet Take 81  mg by mouth daily.   Calcium-Magnesium-Zinc Tabs Take 1 tablet by mouth daily.   cholecalciferol 1000 units tablet Commonly known as:  VITAMIN D Take 1,000 Units by mouth daily.   ferrous sulfate 325 (65 FE) MG tablet Commonly known as:  FERROUSUL Take 1 tablet (325 mg total) by mouth 2 (two) times daily with a meal.   furosemide 40 MG tablet Commonly known as:  LASIX Take 20 mg by mouth  daily.   LORazepam 0.5 MG tablet Commonly known as:  ATIVAN Take 0.25 mg by mouth at bedtime.   NEXIUM 24HR 20 MG capsule Generic drug:  esomeprazole Take 20 mg by mouth daily.   predniSONE 5 MG tablet Commonly known as:  DELTASONE Take 5 mg by mouth daily.   risperiDONE 0.25 MG tablet Commonly known as:  RISPERDAL Take 0.25 mg by mouth 3 (three) times daily.       If you experience worsening of your admission symptoms, develop shortness of breath, life threatening emergency, suicidal or homicidal thoughts you must seek medical attention immediately by calling 911 or calling your MD immediately  if symptoms less severe.  You Must read complete instructions/literature along with all the possible adverse reactions/side effects for all the Medicines you take and that have been prescribed to you. Take any new Medicines after you have completely understood and accept all the possible adverse reactions/side effects.   Please note  You were cared for by a hospitalist during your hospital stay. If you have any questions about your discharge medications or the care you received while you were in the hospital after you are discharged, you can call the unit and asked to speak with the hospitalist on call if the hospitalist that took care of you is not available. Once you are discharged, your primary care physician will handle any further medical issues. Please note that NO REFILLS for any discharge medications will be authorized once you are discharged, as it is imperative that you return to your primary care physician (or establish a relationship with a primary care physician if you do not have one) for your aftercare needs so that they can reassess your need for medications and monitor your lab values. Today   SUBJECTIVE   Hard on hearing. Overall ok per RN  VITAL SIGNS:  Blood pressure (!) 145/72, pulse 72, temperature 97.7 F (36.5 C), resp. rate 19, weight 60.3 kg (133 lb), SpO2 100  %.  I/O:    Intake/Output Summary (Last 24 hours) at 01/20/2018 1132 Last data filed at 01/20/2018 1006 Gross per 24 hour  Intake 440 ml  Output 150 ml  Net 290 ml    PHYSICAL EXAMINATION:  GENERAL:  84 y.o.-year-old patient lying in the bed with no acute distress.  EYES: Pupils equal, round, reactive to light and accommodation. No scleral icterus. Extraocular muscles intact.  HEENT: Head atraumatic, normocephalic. Oropharynx and nasopharynx clear.  NECK:  Supple, no jugular venous distention. No thyroid enlargement, no tenderness.  LUNGS: Normal breath sounds bilaterally, no wheezing, rales,rhonchi or crepitation. No use of accessory muscles of respiration.  CARDIOVASCULAR: S1, S2 normal. No murmurs, rubs, or gallops.  ABDOMEN: Soft, non-tender, non-distended. Bowel sounds present. No organomegaly or mass.  EXTREMITIES: No pedal edema, cyanosis, or clubbing.  NEUROLOGIC: Cranial nerves II through XII are intact. Muscle strength 5/5 in all extremities. Sensation intact. Gait not checked.  PSYCHIATRIC: The patient is alert and oriented x 3.  SKIN: No obvious rash, lesion, or ulcer.   DATA  REVIEW:   CBC  Recent Labs  Lab 01/18/18 0618  WBC 7.6  HGB 9.3*  HCT 29.1*  PLT 172    Chemistries  Recent Labs  Lab 01/17/18 1425 01/18/18 0618  NA 145 144  K 4.8 5.2*  CL 109 109  CO2 28 27  GLUCOSE 149* 152*  BUN 50* 48*  CREATININE 2.14* 2.05*  CALCIUM 8.8* 8.3*  AST 26  --   ALT 10  --   ALKPHOS 58  --   BILITOT 0.7  --     Microbiology Results   No results found for this or any previous visit (from the past 240 hour(s)).  RADIOLOGY:  No results found.   Management plans discussed with the patient, family and they are in agreement.  CODE STATUS:     Code Status Orders  (From admission, onward)        Start     Ordered   01/17/18 1722  Do not attempt resuscitation (DNR)  Continuous    Question Answer Comment  In the event of cardiac or respiratory ARREST  Do not call a "code blue"   In the event of cardiac or respiratory ARREST Do not perform Intubation, CPR, defibrillation or ACLS   In the event of cardiac or respiratory ARREST Use medication by any route, position, wound care, and other measures to relive pain and suffering. May use oxygen, suction and manual treatment of airway obstruction as needed for comfort.      01/17/18 1721    Code Status History    Date Active Date Inactive Code Status Order ID Comments User Context   08/13/2017 1950 08/16/2017 1842 DNR [id number]  Altamese DillingVachhani, Vaibhavkumar, MD ED   12/01/2014 1508 12/03/2014 1858 DNR [id number]  Adrian SaranMody, Sital, MD Inpatient    Advance Directive Documentation     Most Recent Value  Type of Advance Directive  Healthcare Power of Attorney  Pre-existing out of facility DNR order (yellow form or pink MOST form)  -  "MOST" Form in Place?  -      TOTAL TIME TAKING CARE OF THIS PATIENT: *40* minutes.    Enedina FinnerSona Rawad Bochicchio M.D on 01/20/2018 at 11:32 AM  Between 7am to 6pm - Pager - [phone] After 6pm go to [email or web address] - Social research officer, governmentpassword [email or web address]  [facility] [place]  Office  [phone]  CC: Primary care physician; Patrice ParadiseMcLaughlin, Miriam K, MD

## 2018-01-20 NOTE — Progress Notes (Signed)
[facility] - [place] at [facility]   PATIENT NAME: Briana [name]    MR#:  [id number]  DATE OF BIRTH:  1908/03/19  SUBJECTIVE:  very sleepy. Poor historian. No issues per RN  REVIEW OF SYSTEMS:   Review of Systems  Unable to perform ROS: Mental status change     DRUG ALLERGIES:   Allergies  Allergen Reactions  . Amoxicillin Diarrhea    Has patient had a PCN reaction causing immediate rash, facial/tongue/throat swelling, SOB or lightheadedness with hypotension: No Has patient had a PCN reaction causing severe rash involving mucus membranes or skin necrosis: No Has patient had a PCN reaction that required hospitalization: No Has patient had a PCN reaction occurring within the last 10 years: No If all of the above answers are "NO", then may proceed with Cephalosporin use.    VITALS:  Blood pressure (!) 145/72, pulse 72, temperature 97.7 F (36.5 C), resp. rate 19, weight 60.3 kg (133 lb), SpO2 100 %.  PHYSICAL EXAMINATION:   Physical Exam  GENERAL:  84 y.o.-year-old patient lying in the bed with no acute distress. Thin cachectic EYES: Pupils equal, round, reactive to light and accommodation. No scleral icterus. Extraocular muscles intact.  HEENT: Head atraumatic, normocephalic. Oropharynx and nasopharynx clear.  NECK:  Supple, no jugular venous distention. No thyroid enlargement, no tenderness.  LUNGS: Normal breath sounds bilaterally, no wheezing, rales, rhonchi. No use of accessory muscles of respiration.  CARDIOVASCULAR: S1, S2 normal. No murmurs, rubs, or gallops.  ABDOMEN: Soft, nontender, nondistended. Bowel sounds present. No organomegaly or mass. Colostomy + EXTREMITIES: No cyanosis, clubbing or edema b/l.    NEUROLOGIC: unable to assess very sleepy   PSYCHIATRICsleepy SKIN: No obvious rash, lesion, or ulcer.   LABORATORY PANEL:  CBC Recent Labs  Lab 01/18/18 0618  WBC 7.6  HGB 9.3*  HCT 29.1*  PLT 172    Chemistries  Recent  Labs  Lab 01/17/18 1425 01/18/18 0618  NA 145 144  K 4.8 5.2*  CL 109 109  CO2 28 27  GLUCOSE 149* 152*  BUN 50* 48*  CREATININE 2.14* 2.05*  CALCIUM 8.8* 8.3*  AST 26  --   ALT 10  --   ALKPHOS 58  --   BILITOT 0.7  --    Cardiac Enzymes Recent Labs  Lab 01/17/18 1425  TROPONINI 0.07*   RADIOLOGY:  No results found. ASSESSMENT AND PLAN:   84 year old female being admitted for possible asthma exacerbation/acute bronchitis  * Asthma exacerbation/acute bronchitis -Start DuoNeb breathing treatment -received one dose of IV Solu-Medrol and now continue her home dose of prednisone 5 g daily sats >92% on 2 liter [place]--uses oxygen at home  * Acute on Chronic kidney disease stage IV -Likely due to poor p.o. intake cannot rule out ATN or postobstructive -recieved IV hydration, avoid nephrotoxic medications and monitor kidney function  *Elevated troponin -Likely due to demand ischemia, will get serial troponin, monitor on off unit telemetry  *Weakness -We will get a physical therapy and Occupational Therapy consultation Pt has been very deconditioned at hme. Her 84 year old dter is not able to manage her at home and does not have much help. Has financial constriants for private help. She did well after rehab in feb 2019 per dter and niece. Will d/c to rehab ---family feels they will not be able to [facility] her at home with home health that was offered    Case discussed with Care Management/Social Worker. Management plans discussed with  the patient, family and they are in agreement.  CODE STATUS:DNR DVT Prophylaxis: lovenox  TOTAL TIME TAKING CARE OF THIS PATIENT: *25* minutes.  >50% time spent on counselling and coordination of care  POSSIBLE D/C IN *few* DAYS, DEPENDING ON CLINICAL CONDITION.  Note: This dictation was prepared with Dragon dictation along with smaller phrase technology. Any transcriptional errors that result from this process are  unintentional.  Briana [name] M.D on 01/20/2018 at 10:16 AM  Between 7am to 6pm - Pager - [phone]  After 6pm go to [email or web address] - Social research officer, governmentpassword [email or web address]  [facility] [place] [facility]  Office  [phone]  CC: Primary care physician; Briana [name], Briana K, MDPatient ID: Briana [name], female   DOB: 06-17-1908, [age] y.o.   MRN: [id number]

## 2018-01-20 NOTE — Progress Notes (Signed)
Chaplain was rounding palliative care patients. Pt was not alert and is inaudible. Chaplain offered silent prayer and hand holding touch. Pt. Would drift off to sleep and wake with inaudible speech. Patient responds to touch and presence.    01/20/18 1000  Clinical Encounter Type  Visited With Patient  Visit Type Initial  Referral From Chaplain  Spiritual Encounters  Spiritual Needs Prayer

## 2018-01-20 NOTE — Progress Notes (Signed)
[facility] called [facility] [place], EMS called for transportation.

## 2018-01-20 NOTE — Progress Notes (Signed)
Clinical Child psychotherapistocial Worker (CSW) contacted patient's granddaughter Briana [name] and presented bed offers. [facility] has limited bed offers because of her potential need for long term care and [employer] will likely cut her from rehab quickly. Briana [name] verbalized their understanding and chose Briana [name]. Patient's granddaughter Briana [name] is also aware of above.   Patient is medically stable for D/C to Briana [name] today. Per Briana [name] has been received and they can accept patient today. RN will call report and arrange EMS for transport. CSW sent D/C orders to Briana [name] via Briana [name]. Patient's 2 granddaughters Briana [name] and Briana [name] are aware of above. Please reconsult if future social work needs arise. CSW signing off.   [name], LCSW [phone]

## 2018-01-21 DIAGNOSIS — J449 Chronic obstructive pulmonary disease, unspecified: Secondary | ICD-10-CM | POA: Diagnosis not present

## 2018-01-29 DIAGNOSIS — J189 Pneumonia, unspecified organism: Secondary | ICD-10-CM | POA: Diagnosis not present

## 2018-02-21 DIAGNOSIS — J449 Chronic obstructive pulmonary disease, unspecified: Secondary | ICD-10-CM | POA: Diagnosis not present

## 2018-03-01 DIAGNOSIS — J189 Pneumonia, unspecified organism: Secondary | ICD-10-CM | POA: Diagnosis not present

## 2018-04-01 DIAGNOSIS — N184 Chronic kidney disease, stage 4 (severe): Secondary | ICD-10-CM | POA: Diagnosis not present

## 2018-04-01 DIAGNOSIS — I130 Hypertensive heart and chronic kidney disease with heart failure and stage 1 through stage 4 chronic kidney disease, or unspecified chronic kidney disease: Secondary | ICD-10-CM | POA: Diagnosis not present

## 2018-04-01 DIAGNOSIS — I5032 Chronic diastolic (congestive) heart failure: Secondary | ICD-10-CM | POA: Diagnosis not present

## 2018-04-01 DIAGNOSIS — D631 Anemia in chronic kidney disease: Secondary | ICD-10-CM | POA: Diagnosis not present

## 2018-04-15 DEATH — deceased

## 2019-05-30 IMAGING — CT CT HEAD W/O CM
3 series · 15 of 47 positions shown, 18 images · non-contrast
Comparison: CT scan of February 07, 2014.

CLINICAL DATA: Altered level of consciousness.

EXAM:
CT HEAD WITHOUT CONTRAST
TECHNIQUE: Contiguous axial images were obtained from the base of the skull
through the vertex without intravenous contrast.

[Series 2: head wo · axial · 0.42mm/px · z∈[-97,+28]mm · 9 of 30 slices shown, 12 images]
[im 3/30  brain]
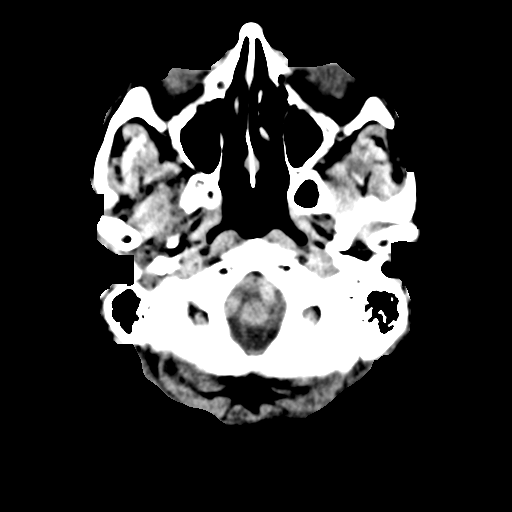
[im 3/30  bone]
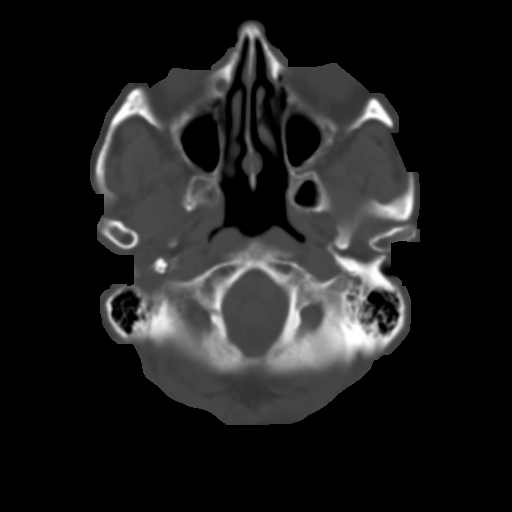
[im 6/30  brain]
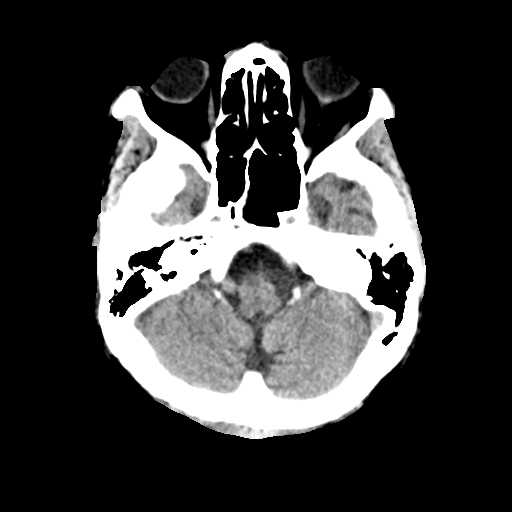
[im 9/30  brain]
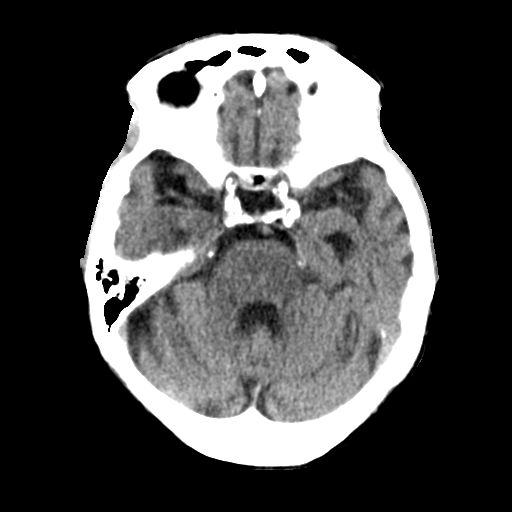
[im 12/30  brain]
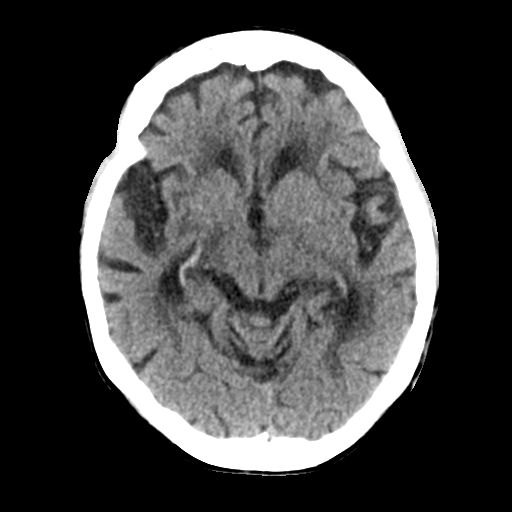
[im 16/30  brain]
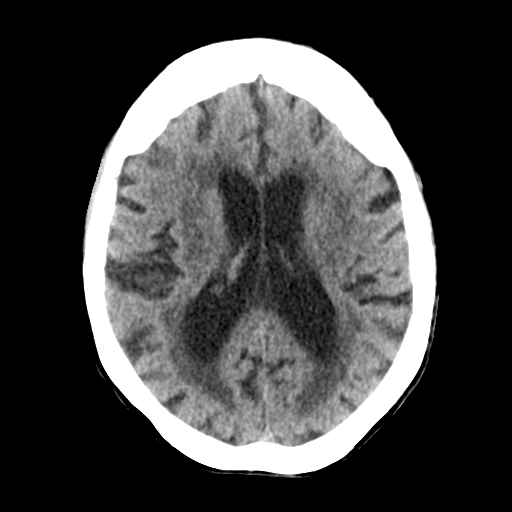
[im 16/30  bone]
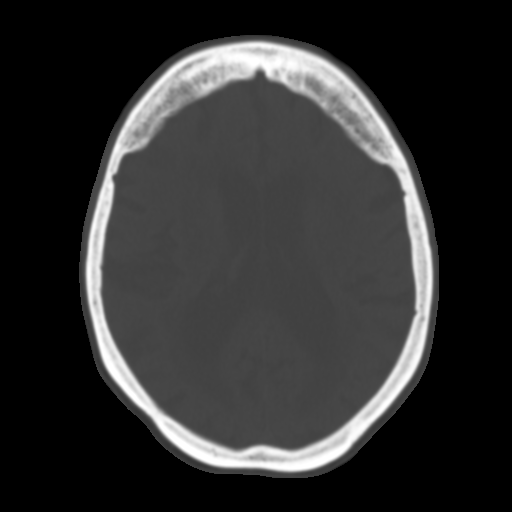
[im 19/30  brain]
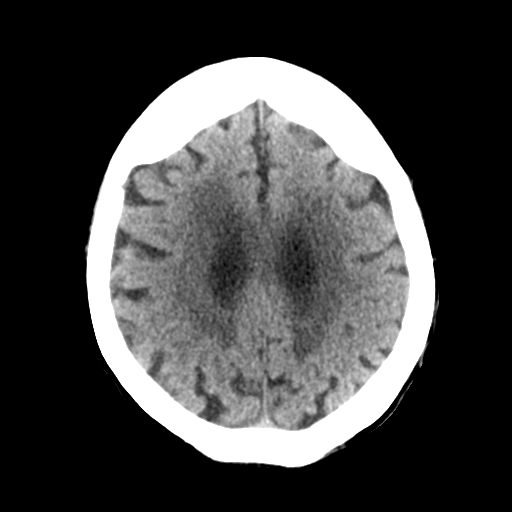
[im 22/30  brain]
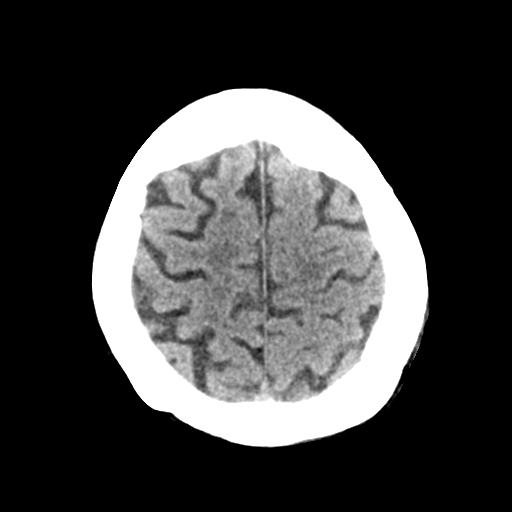
[im 25/30  brain]
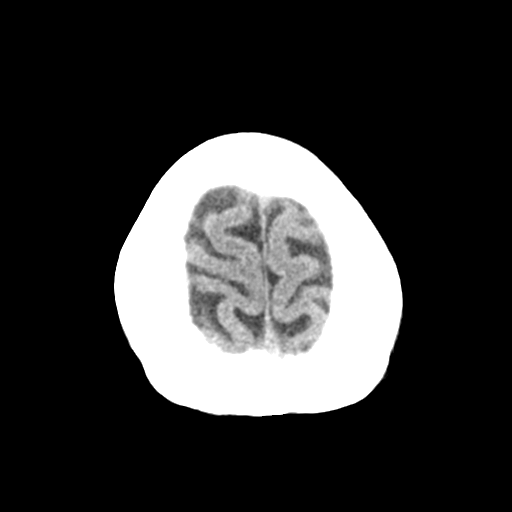
[im 28/30  brain]
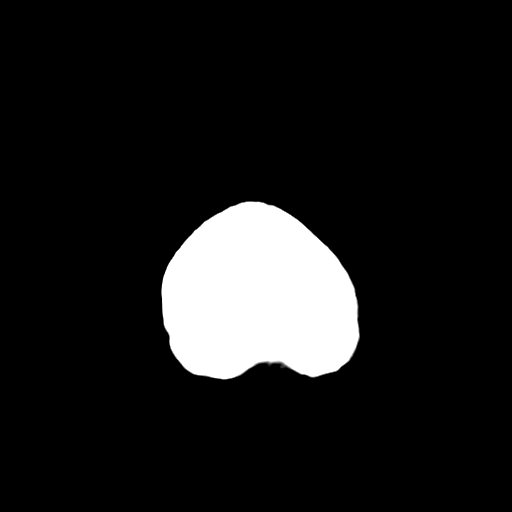
[im 28/30  bone]
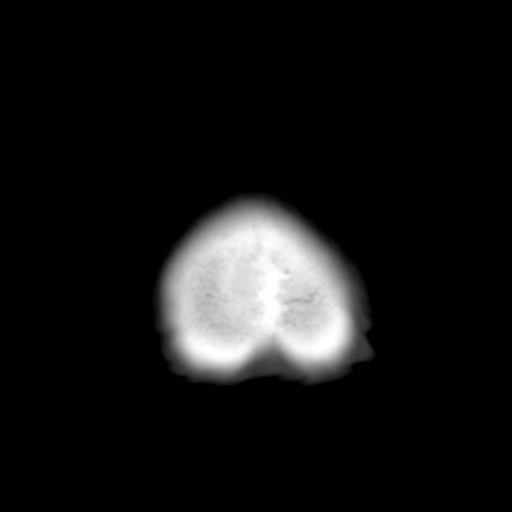

[Series 4: coronal soft tissue · coronal · 0.29mm/px · 3 of 66 slices shown]
[im 22/66  brain]
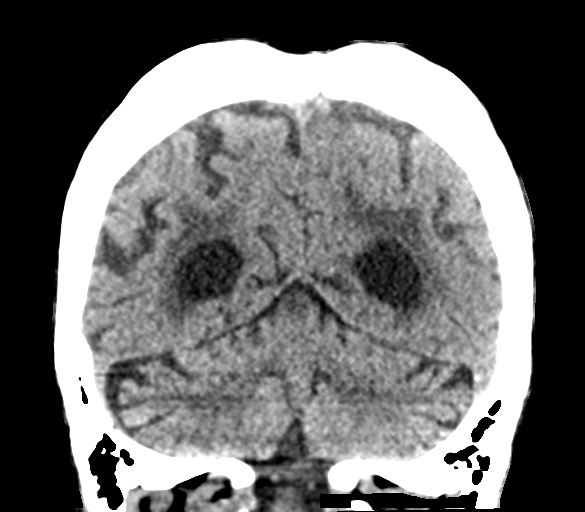
[im 29/66  brain]
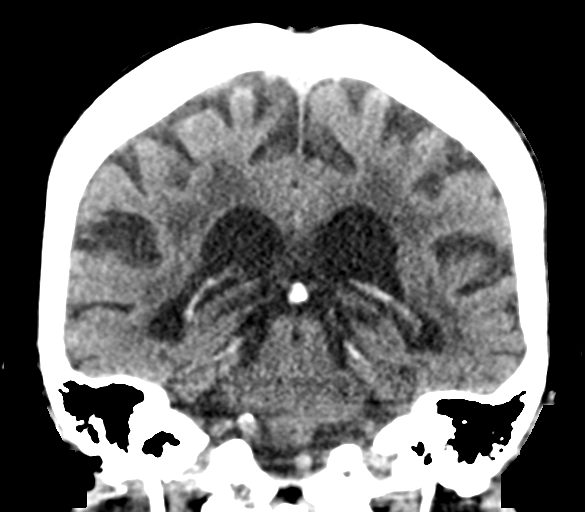
[im 37/66  brain]
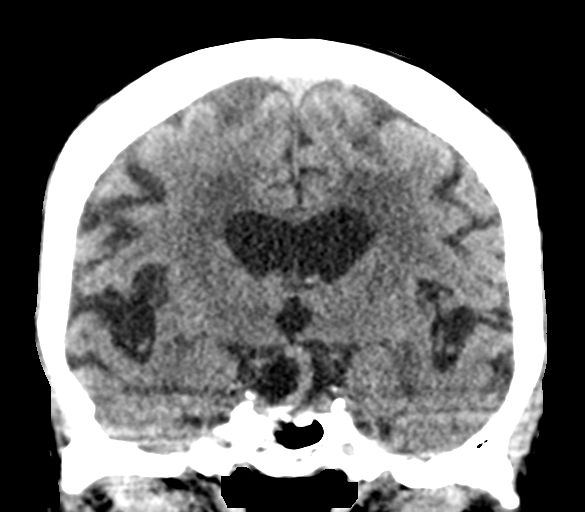

[Series 5: sagittal soft tissue · sagittal · 0.29mm/px · 3 of 52 slices shown]
[im 18/52  brain]
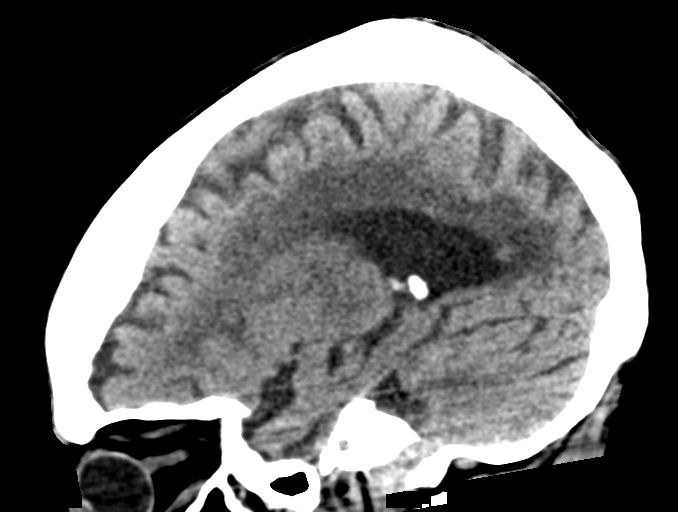
[im 26/52  brain]
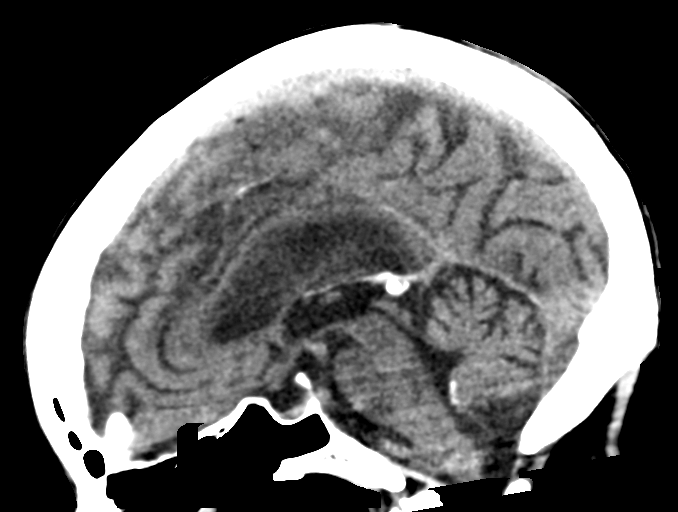
[im 35/52  brain]
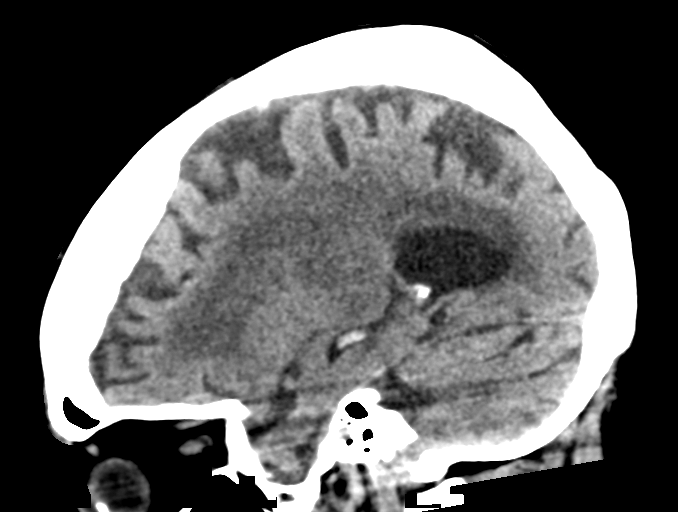

[15 of 47 positions shown; findings below may reference images not displayed]

FINDINGS: Brain: Mild diffuse cortical atrophy is noted. Mild chronic ischemic
white matter disease is noted. No mass effect or midline shift is
noted. Ventricular size is within normal limits. There is no
evidence of mass lesion, hemorrhage or acute infarction.

Vascular: No hyperdense vessel or unexpected calcification.

Skull: Normal. Negative for fracture or focal lesion.

Sinuses/Orbits: No acute finding.

Other: None.
IMPRESSION: Mild diffuse cortical atrophy. Mild chronic ischemic white matter
disease. No acute intracranial abnormality seen.

## 2019-05-30 IMAGING — DX DG CHEST 1V
1 series · 1 of 1 positions shown · non-contrast
Comparison: Chest x-ray of 02/06/2014 and 11/16/2012

CLINICAL DATA: Altered mental status, weakness, confusion

EXAM:
CHEST 1 VIEW

[chest ap]
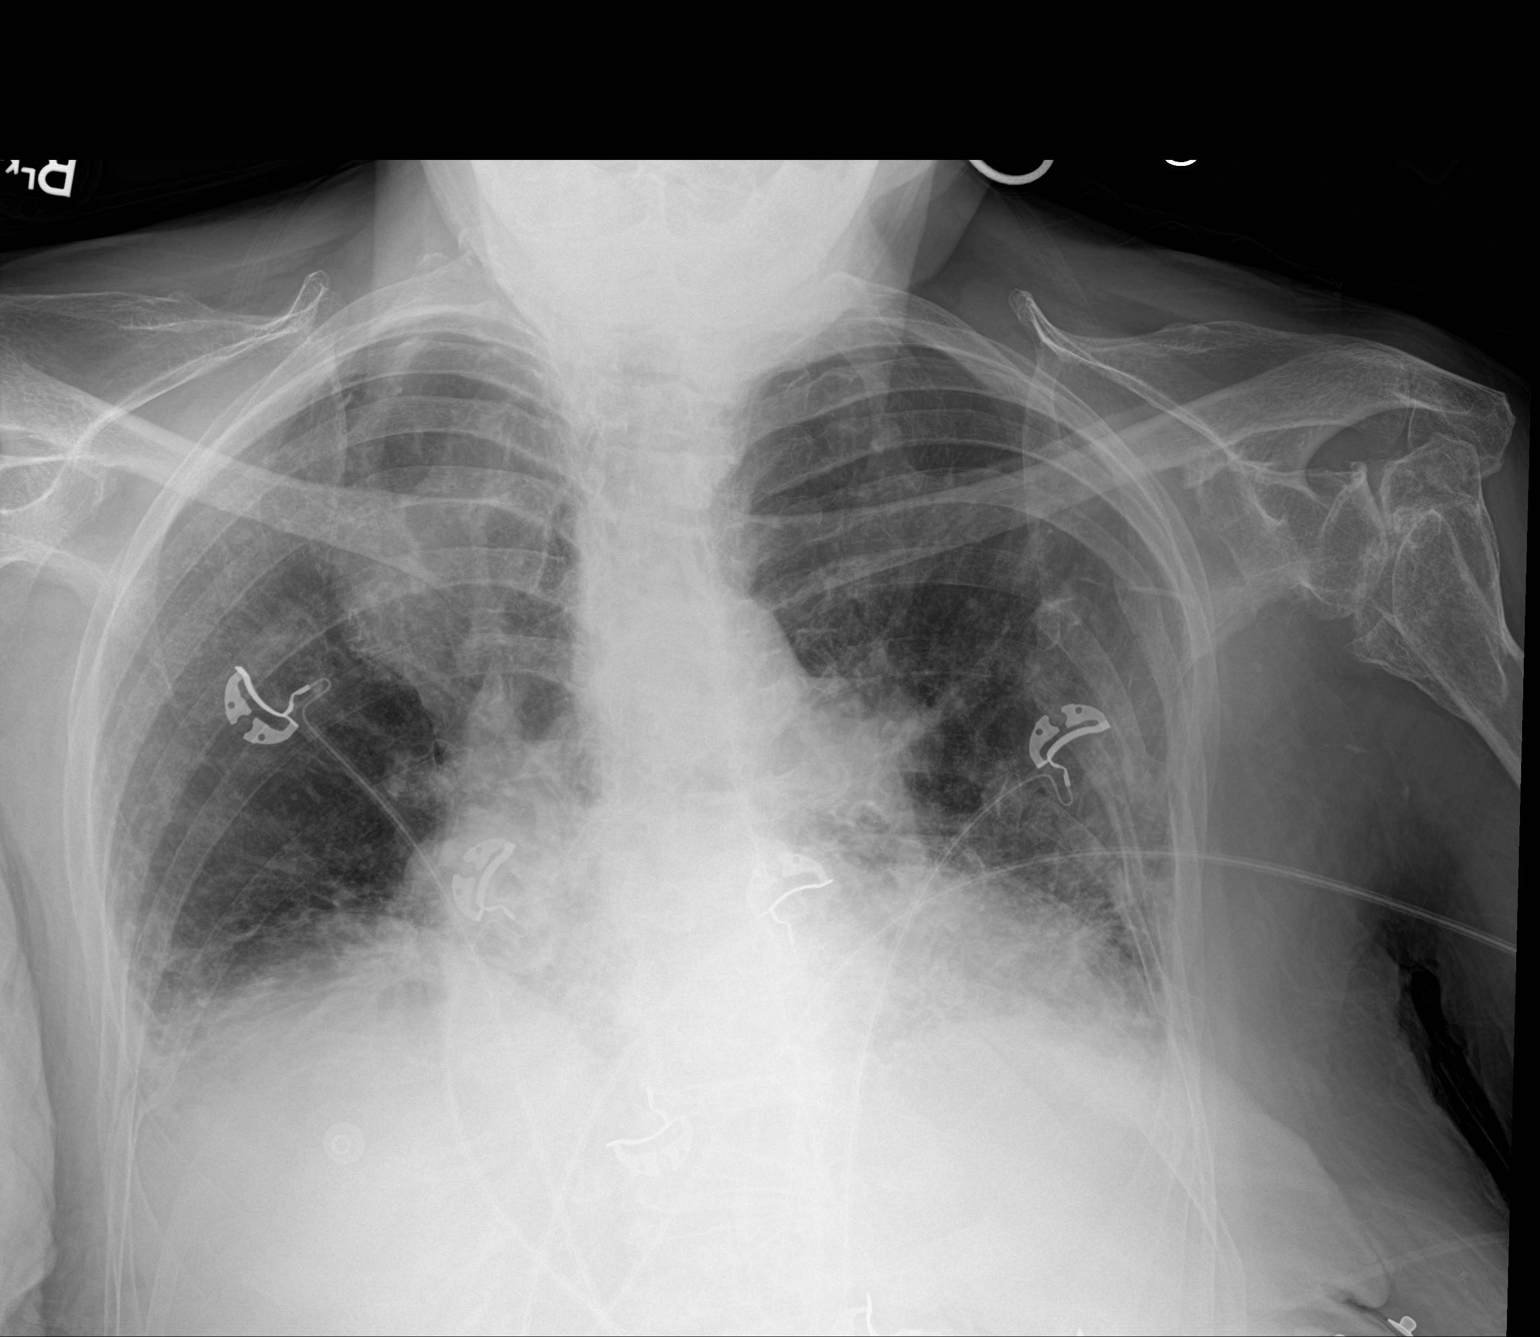

[1 of 1 positions shown; findings below may reference images not displayed]

FINDINGS: The lungs are not well aerated and there is bibasilar linear
atelectasis present. Pneumonia at the lung bases cannot be excluded.
No definite pleural effusion is seen. The heart is mildly enlarged
and stable. Mediastinal and hilar contours are unremarkable.
Prominent soft tissue in the right paratracheal region is stable and
consistent with ectatic great vessels. Old fracture deformity of the
left humeral head and neck is present and unchanged.
IMPRESSION: 1. Poor aeration with bibasilar linear atelectasis. Cannot exclude
developing pneumonia at the lung bases.
2. Stable cardiomegaly.
3. Old fracture deformity of the left humeral head and neck.

## 2019-05-30 IMAGING — DX DG FOOT COMPLETE 3+V*R*
3 series · 3 of 3 positions shown · non-contrast
Comparison: None.

CLINICAL DATA: Pain and swelling of the right foot, weakness,
confusion

EXAM:
RIGHT FOOT COMPLETE - 3+ VIEW

[foot ap]
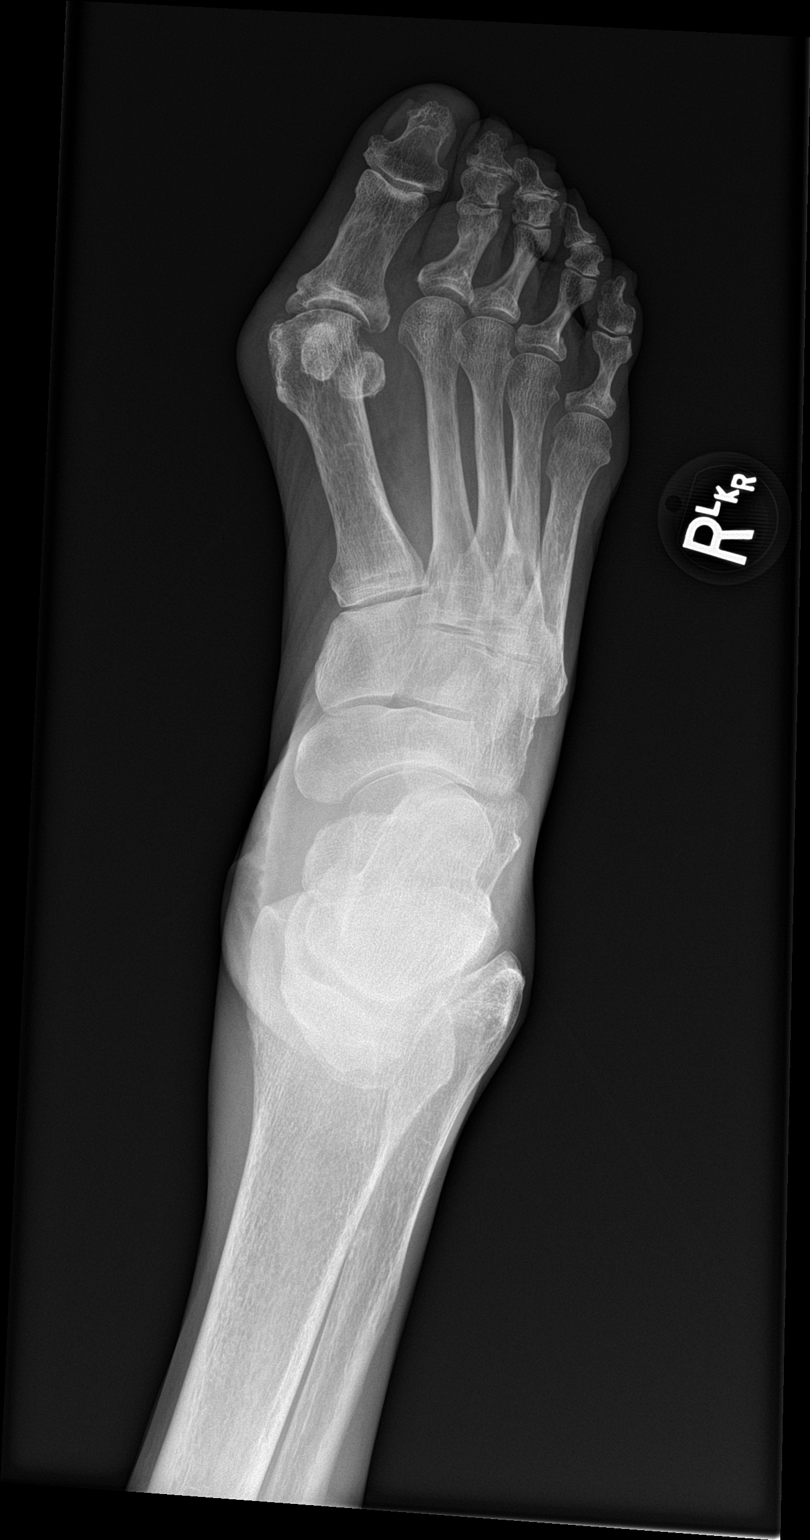

[foot obl]
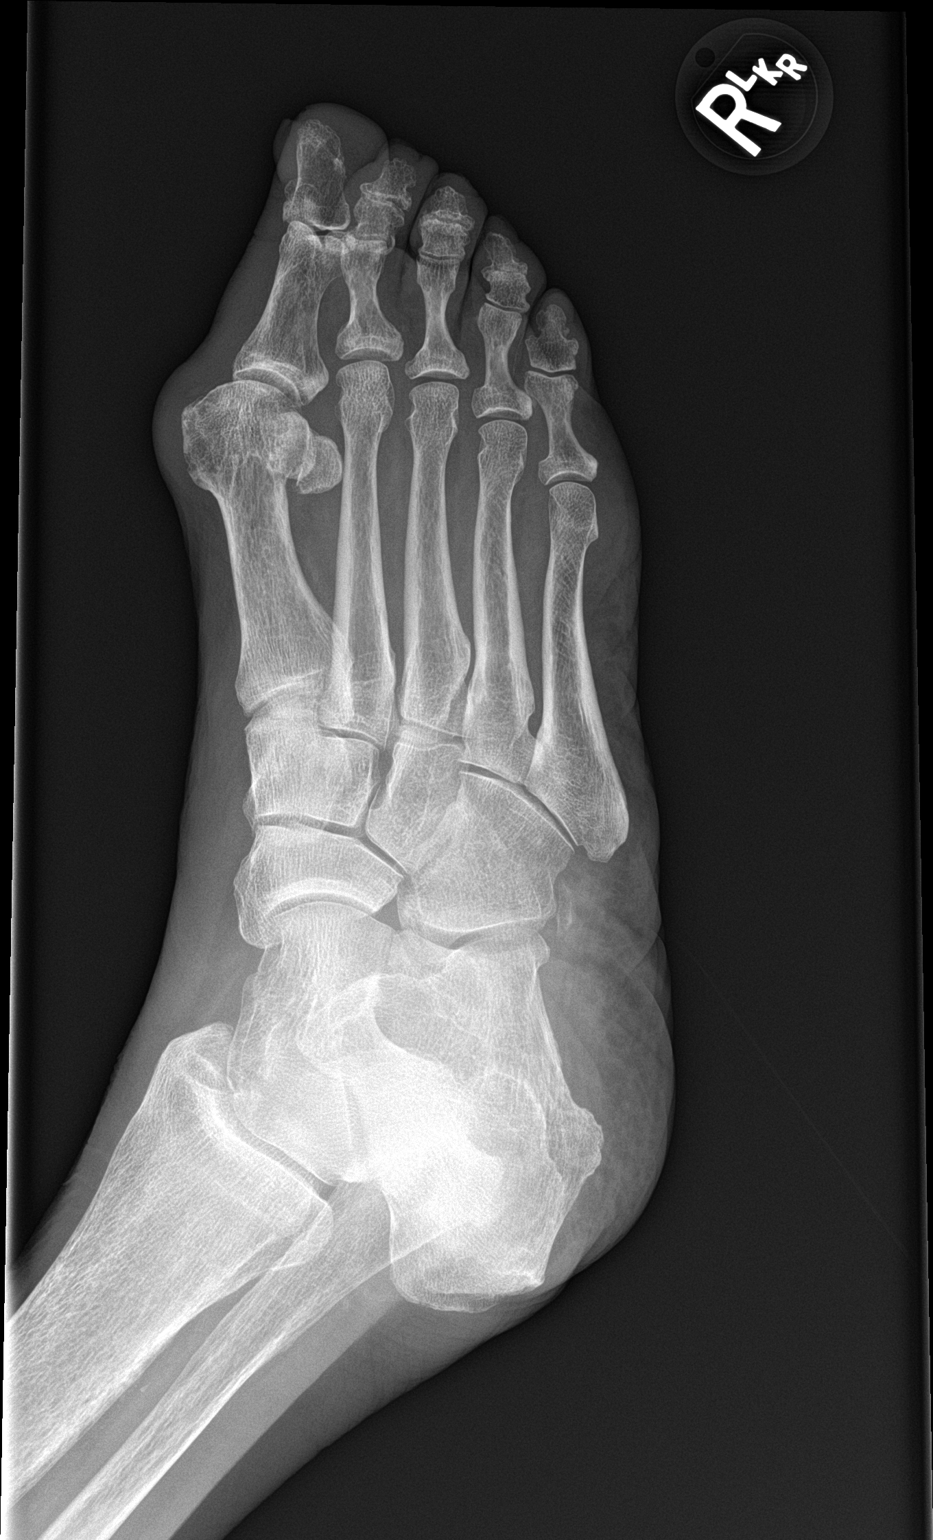

[foot lat]
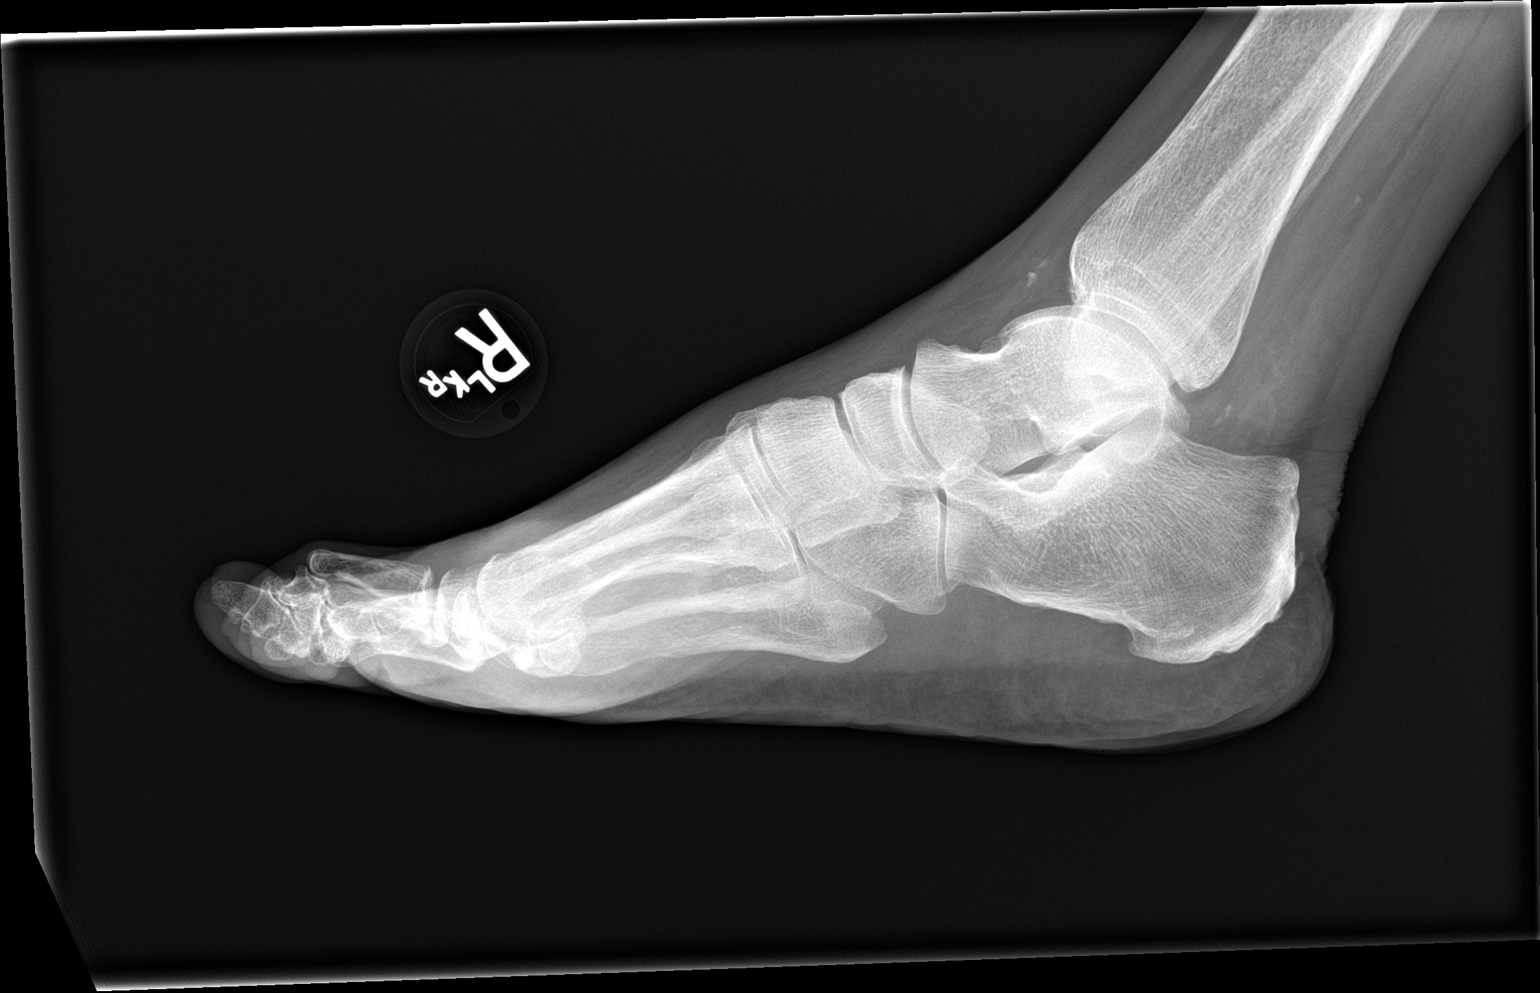

[3 of 3 positions shown; findings below may reference images not displayed]

FINDINGS: There is hallux valgus present. There is some degenerative change
involving the right first MTP joint. However, there is soft tissue
swelling adjacent to the right MTP joint medially with apparent
erosion of the neck of the right first metatarsal. These findings
may indicate arthritis such as gout and clinical correlation is
recommended. Midfoot is unremarkable. Arterial calcifications are
noted.
IMPRESSION: 1. Soft tissue swelling and possible erosion involving the neck of
the right first metatarsal may indicate arthritis such as gout.
Correlate clinically.
2. Hallux valgus.
